# Patient Record
Sex: Male | Born: 1990 | Race: White | Hispanic: No | Marital: Married | Smoking: Never smoker
Health system: Southern US, Community
[De-identification: ages and names within clinical notes are randomized; demographics above are authoritative.]

## PROBLEM LIST (undated history)

## (undated) ENCOUNTER — Ambulatory Visit
Payer: PRIVATE HEALTH INSURANCE | Attending: Student in an Organized Health Care Education/Training Program | Primary: Student in an Organized Health Care Education/Training Program

## (undated) ENCOUNTER — Ambulatory Visit

## (undated) ENCOUNTER — Encounter

## (undated) ENCOUNTER — Encounter: Attending: Infectious Disease | Primary: Infectious Disease

## (undated) ENCOUNTER — Telehealth

## (undated) ENCOUNTER — Telehealth
Attending: Student in an Organized Health Care Education/Training Program | Primary: Student in an Organized Health Care Education/Training Program

## (undated) ENCOUNTER — Telehealth: Attending: Family | Primary: Family

## (undated) ENCOUNTER — Encounter: Attending: Family | Primary: Family

## (undated) ENCOUNTER — Encounter
Attending: Student in an Organized Health Care Education/Training Program | Primary: Student in an Organized Health Care Education/Training Program

## (undated) ENCOUNTER — Ambulatory Visit: Attending: Family | Primary: Family

## (undated) ENCOUNTER — Ambulatory Visit
Attending: Student in an Organized Health Care Education/Training Program | Primary: Student in an Organized Health Care Education/Training Program

## (undated) ENCOUNTER — Ambulatory Visit: Payer: PRIVATE HEALTH INSURANCE | Attending: Infectious Disease | Primary: Infectious Disease

## (undated) DIAGNOSIS — F32A Depression, unspecified: Secondary | ICD-10-CM

## (undated) DIAGNOSIS — Z21 Asymptomatic human immunodeficiency virus [HIV] infection status: Secondary | ICD-10-CM

## (undated) DIAGNOSIS — K219 Gastro-esophageal reflux disease without esophagitis: Secondary | ICD-10-CM

## (undated) DIAGNOSIS — F419 Anxiety disorder, unspecified: Secondary | ICD-10-CM

## (undated) DIAGNOSIS — F329 Major depressive disorder, single episode, unspecified: Secondary | ICD-10-CM

## (undated) DIAGNOSIS — B20 Human immunodeficiency virus [HIV] disease: Secondary | ICD-10-CM

## (undated) HISTORY — DX: Human immunodeficiency virus (HIV) disease: B20

## (undated) HISTORY — DX: Anxiety disorder, unspecified: F41.9

## (undated) HISTORY — DX: Gastro-esophageal reflux disease without esophagitis: K21.9

## (undated) HISTORY — PX: VASECTOMY: SHX75

## (undated) HISTORY — DX: Asymptomatic human immunodeficiency virus (hiv) infection status: Z21

## (undated) HISTORY — PX: EXTERNAL EAR SURGERY: SHX627

## (undated) MED ORDER — VORTIOXETINE 20 MG TABLET: ORAL | 0 days

---

## 2017-08-19 ENCOUNTER — Encounter: Payer: Self-pay | Admitting: Emergency Medicine

## 2017-08-19 ENCOUNTER — Emergency Department
Admission: EM | Admit: 2017-08-19 | Discharge: 2017-08-19 | Disposition: A | Discharge status: Home / Self Care | Payer: BLUE CROSS/BLUE SHIELD | Attending: Emergency Medicine | Admitting: Emergency Medicine

## 2017-08-19 DIAGNOSIS — S6981XA Other specified injuries of right wrist, hand and finger(s), initial encounter: Secondary | ICD-10-CM | POA: Diagnosis present

## 2017-08-19 DIAGNOSIS — Y929 Unspecified place or not applicable: Secondary | ICD-10-CM | POA: Diagnosis not present

## 2017-08-19 DIAGNOSIS — Y999 Unspecified external cause status: Secondary | ICD-10-CM | POA: Insufficient documentation

## 2017-08-19 DIAGNOSIS — W5321XA Bitten by squirrel, initial encounter: Secondary | ICD-10-CM | POA: Insufficient documentation

## 2017-08-19 DIAGNOSIS — S61230A Puncture wound without foreign body of right index finger without damage to nail, initial encounter: Secondary | ICD-10-CM | POA: Insufficient documentation

## 2017-08-19 DIAGNOSIS — Y9301 Activity, walking, marching and hiking: Secondary | ICD-10-CM | POA: Insufficient documentation

## 2017-08-19 HISTORY — DX: Depression, unspecified: F32.A

## 2017-08-19 HISTORY — DX: Major depressive disorder, single episode, unspecified: F32.9

## 2017-08-19 MED ORDER — AMOXICILLIN-POT CLAVULANATE 875-125 MG PO TABS: 1.0000 | ORAL_TABLET | Freq: Two times a day (BID) | ORAL | 0 refills | Status: DC

## 2017-08-19 NOTE — ED Provider Notes (Signed)
[facility]-EMERGENCY DEPARTMENT Provider Note   CSN: [id number] Arrival date & time: 08/19/17  1502     History   Chief Complaint Chief Complaint  Patient presents with  . Animal Bite    HPI Craig Hicks is a 26 y.o. male who presents to the emergency department for evaluation of scrotal bite to the right index finger, dorsal aspect of the PIP joint yesterday. Patient states he was taking his dog for a wall. Captured a small squirrel, the patient went to remove squirrel from his dog mouth squirrel bit him on the dorsal aspect of the right index finger just proximal to the PIP joint dorsally. Patient states he washed the wound. Patient has not had any pain swelling or redness through the index finger. His tetanus is up-to-date. He is concerned about possible need for rabies prophylaxis. Patient states the squirrel has been sent to [facility] for testing.  HPI  Past Medical History:  Diagnosis Date  . Depression     There are no active problems to display for this patient.   History reviewed. No pertinent surgical history.     Home Medications    Prior to Admission medications   Medication Sig Start Date End Date Taking? Authorizing Provider  amoxicillin-clavulanate (AUGMENTIN) 875-125 MG tablet Take 1 tablet by mouth every 12 (twelve) hours. 7 days 08/19/17   Evon Slack, PA-C    Family History No family history on file.  Social History Social History  Substance Use Topics  . Smoking status: Never Smoker  . Smokeless tobacco: Never Used  . Alcohol use Yes     Comment: occ     Allergies   Patient has no known allergies.   Review of Systems Review of Systems  Constitutional: Negative for chills and fever.  Musculoskeletal: Negative for arthralgias.  Skin: Positive for wound. Negative for color change, pallor and rash.  Neurological: Negative for tremors and headaches.     Physical Exam Updated Vital Signs BP 114/61 (BP Location: Left Arm)   Pulse 61    Temp 99.3 F (37.4 C) (Oral)   Resp 16   Ht 6\' 2"  (1.88 m)   Wt 77.1 kg (170 lb)   SpO2 98%   BMI 21.83 kg/m   Physical Exam  Constitutional: He appears well-developed and well-nourished.  HENT:  Head: Normocephalic and atraumatic.  Eyes: Conjunctivae are normal.  Cardiovascular: Normal rate.   Pulmonary/Chest: No respiratory distress.  Musculoskeletal:  Examination of the right hand shows patient is a full composite fist. There is a small abrasion with possible small superficial puncture wound to the dorsal aspect of the right index finger just proximal to the PIP joint.He has full range of motion of the P appearing DIP joint of the right index finger with no pain. There is no swelling, redness warmth or drainage.     ED Treatments / Results  Labs (all labs ordered are listed, but only abnormal results are displayed) Labs Reviewed - No data to display  EKG  EKG Interpretation None       Radiology No results found.  Procedures Procedures (including critical care time)  Medications Ordered in ED Medications - No data to display   Initial Impression / Assessment and Plan / ED Course  I have reviewed the triage vital signs and the nursing notes.  Pertinent labs & imaging results that were available during my care of the patient were reviewed by me and considered in my medical decision making (see chart for details).  26 year old male with squirrel bite to the dorsal aspect of the right index finger yesterday. Squirrel was sent to [facility] to be tested for rabies. Patient's tetanus is up-to-date. He is placed on oral antibiotic for infection prophylaxis due to puncture wound being close to the joint. We discussed the need for rabies prophylaxis and based on up-to-date guidelines/algorithm. Postexposure prophylaxis for squirrel bite to the distal extremity while the squirrel is undergoing testing for rabies is not indicated.  Final Clinical Impressions(s) / ED  Diagnoses   Final diagnoses:  Wound due to squirrel bite    New Prescriptions New Prescriptions   AMOXICILLIN-CLAVULANATE (AUGMENTIN) 875-125 MG TABLET    Take 1 tablet by mouth every 12 (twelve) hours. 7 days     Craig Hicks 08/19/17 1608    Craig Semen, MD 08/19/17 [phone]

## 2017-08-19 NOTE — ED Triage Notes (Signed)
Pt states that his dog was pulling a squirrel out of a bush yesterday and he tried getting the squirrel out of the dogs mouth and the squirrel bit him on the right index finger. Would like to get rabies vaccine.

## 2017-08-19 NOTE — ED Notes (Signed)

## 2018-01-25 ENCOUNTER — Ambulatory Visit: Payer: Self-pay

## 2018-03-22 ENCOUNTER — Emergency Department
Admission: EM | Admit: 2018-03-22 | Discharge: 2018-03-22 | Disposition: A | Discharge status: Home / Self Care | Payer: Worker's Compensation | Attending: Emergency Medicine | Admitting: Emergency Medicine

## 2018-03-22 ENCOUNTER — Other Ambulatory Visit: Payer: Self-pay

## 2018-03-22 ENCOUNTER — Encounter: Payer: Self-pay | Admitting: Emergency Medicine

## 2018-03-22 DIAGNOSIS — Z203 Contact with and (suspected) exposure to rabies: Secondary | ICD-10-CM | POA: Diagnosis not present

## 2018-03-22 NOTE — ED Provider Notes (Signed)
[facility] Emergency Department Provider Note   ____________________________________________   First MD Initiated Contact with Patient 03/22/18 1359     (approximate)  I have reviewed the triage vital signs and the nursing notes.   HISTORY  Chief Complaint Rabies Injection    HPI Craig Hicks is a 27 y.o. male patient exposed to a bat while at work.  Patient the bed fell on his neck.  Patient denied bite or scratch from the encounter.  Patient was able to capture the [facility] [employer] [facility] and turn over to animal control.  Patient states tetanus shot is less than 10 years.  Past Medical History:  Diagnosis Date  . Depression     There are no active problems to display for this patient.   History reviewed. No pertinent surgical history.  Prior to Admission medications   Medication Sig Start Date End Date Taking? Authorizing Provider  amoxicillin-clavulanate (AUGMENTIN) 875-125 MG tablet Take 1 tablet by mouth every 12 (twelve) hours. 7 days 08/19/17   Evon SlackGaines, Thomas C, PA-C    Allergies Patient has no known allergies.  No family history on file.  Social History Social History   Tobacco Use  . Smoking status: Never Smoker  . Smokeless tobacco: Never Used  Substance Use Topics  . Alcohol use: Yes    Comment: occ  . Drug use: No    Review of Systems Constitutional: No fever/chills Eyes: No visual changes. ENT: No sore throat. Cardiovascular: Denies chest pain. Respiratory: Denies shortness of breath. Gastrointestinal: No abdominal pain.  No nausea, no vomiting.  No diarrhea.  No constipation. Genitourinary: Negative for dysuria. Musculoskeletal: Negative for back pain. Skin: Negative for rash. Neurological: Negative for headaches, focal weakness or numbness.   ____________________________________________   PHYSICAL EXAM:  VITAL SIGNS: ED Triage Vitals  Enc Vitals Group     BP 03/22/18 1217 120/62     Pulse Rate 03/22/18 1217 77     Resp  03/22/18 1217 18     Temp 03/22/18 1217 98.8 F (37.1 C)     Temp Source 03/22/18 1217 Oral     SpO2 03/22/18 1217 99 %     Weight 03/22/18 1208 180 lb (81.6 kg)     Height 03/22/18 1208 6\' 2"  (1.88 m)     Head Circumference --      Peak Flow --      Pain Score 03/22/18 1208 0     Pain Loc --      Pain Edu? --      Excl. in GC? --    Constitutional: Alert and oriented. Well appearing and in no acute distress. Cardiovascular: Normal rate, regular rhythm. Grossly normal heart sounds.  Good peripheral circulation. Respiratory: Normal respiratory effort.  No retractions. Lungs CTAB. Musculoskeletal: No lower extremity tenderness nor edema.  No joint effusions. Neurologic:  Normal speech and language. No gross focal neurologic deficits are appreciated. No gait instability. Skin:  Skin is warm, dry and intact. No rash noted.  Patient posterior neck reveals no scratches, punctures, or bite marks. Psychiatric: Mood and affect are normal. Speech and behavior are normal.  ____________________________________________   LABS (all labs ordered are listed, but only abnormal results are displayed)  Labs Reviewed - No data to display ____________________________________________  EKG   ____________________________________________  RADIOLOGY  ED MD interpretation:    Official radiology report(s): No results found.  ____________________________________________   PROCEDURES  Procedure(s) performed:   Procedures  Critical Care performed: No  ____________________________________________  INITIAL IMPRESSION / ASSESSMENT AND PLAN / ED COURSE  As part of my medical decision making, I reviewed the following data within the electronic MEDICAL RECORD NUMBER   Patient presents with possible exposure to rabies from flying bat exposure.  The encounter produced no bite marks, scratches, or puncture wounds.  Animal control has a contained and confined to bed for observation for the next 10  days.  Patient given discharge care instruction advised to return back if the bed test positive for rabies.      ____________________________________________   FINAL CLINICAL IMPRESSION(S) / ED DIAGNOSES  Final diagnoses:  Contact with and (suspected) exposure to rabies     ED Discharge Orders    None       Note:  This document was prepared using Dragon voice recognition software and may include unintentional dictation errors.    Joni Reining, PA-C 03/22/18 1420    Sharyn Creamer, MD 03/23/18 [phone]

## 2018-03-22 NOTE — ED Notes (Signed)
Sent in by employer  States he had a bat fall from tree and landed on neck  No bite

## 2018-03-22 NOTE — ED Triage Notes (Signed)
Exposed to a bat while at work.  States bat fell onto back of neck.    Employed at [employer].

## 2019-12-31 DIAGNOSIS — B20 Human immunodeficiency virus [HIV] disease: Secondary | ICD-10-CM | POA: Insufficient documentation

## 2020-06-15 DIAGNOSIS — B20 Human immunodeficiency virus [HIV] disease: Principal | ICD-10-CM

## 2020-06-22 ENCOUNTER — Encounter
Admit: 2020-06-22 | Discharge: 2020-06-22 | Payer: PRIVATE HEALTH INSURANCE | Attending: Infectious Disease | Primary: Infectious Disease

## 2020-06-22 ENCOUNTER — Ambulatory Visit
Admit: 2020-06-22 | Discharge: 2020-06-23 | Payer: PRIVATE HEALTH INSURANCE | Attending: Addiction (Substance Use Disorder) | Primary: Addiction (Substance Use Disorder)

## 2020-06-22 DIAGNOSIS — B20 Human immunodeficiency virus [HIV] disease: Principal | ICD-10-CM

## 2020-06-22 LAB — HM HEPATITIS C SCREENING LAB: HM Hepatitis Screen: NEGATIVE

## 2020-06-24 DIAGNOSIS — B20 Human immunodeficiency virus [HIV] disease: Principal | ICD-10-CM

## 2020-06-24 MED ORDER — BICTEGRAVIR 50 MG-EMTRICITABINE 200 MG-TENOFOVIR ALAFENAM 25 MG TABLET
ORAL_TABLET | Freq: Every day | ORAL | 11 refills | 30.00000 days | Status: CP
Start: 2020-06-24 — End: ?
  Filled 2020-07-01: qty 30, 30d supply, fill #0

## 2020-06-25 DIAGNOSIS — B20 Human immunodeficiency virus [HIV] disease: Principal | ICD-10-CM

## 2020-06-30 NOTE — Unmapped (Signed)
[facility] Specialty Medication Onboarding    Specialty Medication: Biktarvy tablets  Prior Authorization: Not Required   Financial Assistance: Yes - copay card approved as secondary   Final Copay/Day Supply: $0 / 30 days    Insurance Restrictions: Yes - max 1 month supply     Notes to Pharmacist:     The triage team has completed the benefits investigation and has determined that the patient is able to fill this medication at [facility]. Please contact the patient to complete the onboarding or follow up with the prescribing physician as needed.

## 2020-06-30 NOTE — Unmapped (Signed)
Patient complete RW application. Patient is eligible for RW B&C grant services and Caps on Charges. Eligibility expires 03/24/21.       RW Eligibility Form informing patient about RW services and Caps on charges was sent to patient.    If HMAP app: N/A    Larey Dresser, [place] [facility] Benefits Admin  Duration of intervention: 10 minutes

## 2020-06-30 NOTE — Unmapped (Signed)
[facility]   Patient Onboarding/Medication Counseling    Mr.Austin Thompson is a 29 y.o. male with HIV who I am counseling today on initiation of therapy.  I am speaking to the patient.    Was a Nurse, learning disability used for this call? No    Verified patient's date of birth / HIPAA.    Specialty medication(s) to be sent: Infectious Disease: Biktarvy      Non-specialty medications/supplies to be sent: n/a      Medications not needed at this time: n/a         Biktarvy (bictegravir, emtricitabine, and tenofovir alafenamide)    Medication & Administration     Dosage: Take 1 tablet by mouth daily    Administration: Take without regard to food    Adherence/Missed dose instructions: take missed dose as soon as you remember. If it is close to the time of your next dose, skip the dose and resume with your next scheduled dose.    Goals of Therapy     To suppress viral replication and keep patient's HIV undetectable by lab tests    Side Effects & Monitoring Parameters     Common Side Effects:  ??? Diarrhea  ??? Upset stomach  ??? Headache  ??? Changes in Weight  ??? Changes in mood    The following side effects should be reported to the provider:     ?? If patient experiences: signs of an allergic reaction (rash; hives; itching; red, swollen, blistered, or peeling skin with or without fever; wheezing; tightness in the chest or throat; trouble breathing, swallowing, or talking; unusual hoarseness; or swelling of the mouth, face, lips, tongue, or throat)  ?? signs of kidney problems (unable to pass urine, change in how much urine is passed, blood in the urine, or a big weight gain)  ?? signs of liver problems (dark urine, feeling tired, not hungry, upset stomach or stomach pain, light-colored stools, throwing up, or yellow skin or eyes)  ?? signs of lactic acidosis (fast breathing, fast heartbeat, a heartbeat that does not feel normal, very bad upset stomach or throwing up, feeling very sleepy, shortness of breath, feeling very tired or weak, very bad dizziness, feeling cold, or muscle pain or cramps)  Weight gain: some patients have reported weight gain after starting this medication. The amount of weight can vary.    Monitoring Parameters:  ?? CD4  Count  ?? HIV RNA plasma levels,  ?? Liver function  ?? Total bilirubin  ?? serum creatinine  ?? urine glucose  ?? urine protein (prior to or when initiating therapy and as clinically indicated during therapy);       Drug/Food Interactions     ??? Medication list reviewed in Epic. The patient was instructed to inform the care team before taking any new medications or supplements. No drug interactions identified.   ??? Calcium Salts: May decrease the serum concentration of Biktarvy. If taken with food, Biktarvy can be administered with calcium salts.   ??? Iron Preparations: May decrease the serum concentration of Biktarvy. If taken with food, Biktarvy can be administered with Ferrous sulfate. If taken on an empty stomach, Biktarvy must be taken 2 hours before ferrous sulfate. Avoid other iron salts.    Contraindications, Warnings, & Precautions     ??? Black Box Warning: Severe acute exacertbations of HBV have been reported in patients coinfected with HIV-1 and HBV fllowing discontinuation of therapy  ??? Coadministration with dofetilide, rifampin is contraindicated  ??? Immune reconstitution  syndrome: Patients may develop immune reconstitution syndrome, resulting in the occurrence of an inflammatory response to an indolent or residual opportunistic infection or activation of autoimmune disorders (eg, Graves disease, polymyositis, Guillain-Barr?? syndrome, autoimmune hepatitis)   ??? Lactic acidosis/hepatomegaly  ??? Renal toxicity: patients with preexisting renal impairment and those taking nephrotoxic agents (including NSAIDs) are at increased risk.     Storage, Handling Precautions, & Disposal     ??? Store in the original container at room temperature.   ??? Keep lid tightly closed.   ??? Store in a dry place. Do not store in a bathroom.   ??? Keep all drugs in a safe place. Keep all drugs out of the reach of children and pets.   ??? Throw away unused or expired drugs. Do not flush down a toilet or pour down a drain unless you are told to do so. Check with your pharmacist if you have questions about the best way to throw out drugs. There may be drug take-back programs in your area.        Current Medications (including OTC/herbals), Comorbidities and Allergies     Current Outpatient Medications   Medication Sig Dispense Refill   ??? amoxicillin (MOXATAG) 775 mg 24 hr tablet Take 775 mg by mouth daily. (Patient not taking: Reported on 06/30/2020)     ??? bictegrav-emtricit-tenofov ala (BIKTARVY) 50-200-25 mg tablet Take 1 tablet by mouth daily. 30 tablet 11   ??? fexofenadine (ALLEGRA) 180 MG tablet Take 180 mg by mouth daily.       No current facility-administered medications for this visit.       No Known Allergies    Patient Active Problem List   Diagnosis   ??? HIV disease (CMS-HCC)   ??? LAD (lymphadenopathy), cervical   ??? Depression       Reviewed and up to date in Epic.    Appropriateness of Therapy     Is medication and dose appropriate based on diagnosis? Yes    Prescription has been clinically reviewed: Yes    Baseline Quality of Life Assessment      How many days over the past month did your HIV  keep you from your normal activities? For example, brushing your teeth or getting up in the morning. 0    Financial Information     Medication Assistance provided: Copay Assistance    Anticipated copay of $0.00 reviewed with patient. Verified delivery address.    Delivery Information     Scheduled delivery date: 07/01/20    Expected start date: 07/01/20    Medication will be delivered via Same Day Courier to the prescription address in [facility].  This shipment will not require a signature.      Explained the services we provide at [facility] and that each month we would call to set up refills.  Stressed importance of returning phone calls so that we could ensure they receive their medications in time each month.  Informed patient that we should be setting up refills 7-10 days prior to when they will run out of medication.  A pharmacist will reach out to perform a clinical assessment periodically.  Informed patient that a welcome packet and a drug information handout will be sent.      Patient verbalized understanding of the above information as well as how to contact the pharmacy at [phone] option 4 with any questions/concerns.  The pharmacy is open Monday through Friday 8:30am-4:30pm.  A pharmacist is available 24/7  via pager to answer any clinical questions they may have.    Patient Specific Needs     - Does the patient have any physical, cognitive, or cultural barriers? No    - Patient prefers to have medications discussed with  Patient     - Is the patient or caregiver able to read and understand education materials at a high school level or above? Yes    - Patient's primary language is  English     - Is the patient high risk? No     - Does the patient require a Care Management Plan? No     - Does the patient require physician intervention or other additional services (i.e. nutrition, smoking cessation, social work)? No      [name]  [facility] Specialty Pharmacist

## 2020-07-01 MED FILL — BIKTARVY 50 MG-200 MG-25 MG TABLET: 30 days supply | Qty: 30 | Fill #0 | Status: AC

## 2020-07-02 LAB — HLA-B*5701: Lab: NEGATIVE

## 2020-07-10 LAB — GNSURE INTEGRASE: Lab: 0

## 2020-07-10 LAB — HIV GENOTYPING: Lab: 0

## 2020-07-23 NOTE — Unmapped (Signed)
[facility] Clinical Assessment & Refill Coordination Note    Austin Thompson, DOB: [birth year]  Phone: [phone] (home)     All above HIPAA information was verified with patient.     Was a Nurse, learning disability used for this call? No    Specialty Medication(s):   Infectious Disease: Biktarvy     Current Outpatient Medications   Medication Sig Dispense Refill   ??? amoxicillin (MOXATAG) 775 mg 24 hr tablet Take 775 mg by mouth daily. (Patient not taking: Reported on 06/30/2020)     ??? bictegrav-emtricit-tenofov ala (BIKTARVY) 50-200-25 mg tablet Take 1 tablet by mouth daily. 30 tablet 11   ??? fexofenadine (ALLEGRA) 180 MG tablet Take 180 mg by mouth daily.       No current facility-administered medications for this visit.        Changes to medications: Boss reports no changes at this time.    No Known Allergies    Changes to allergies: No    SPECIALTY MEDICATION ADHERENCE     Biktarvy   : 12 days of medicine on hand. He did not start the medication on 07/08 as originally stated. He says he takes it religiously at the same time every day.    Medication Adherence    Patient reported X missed doses in the last month: 0  Specialty Medication: Biktarvy  Patient is on additional specialty medications: No  Demonstrates understanding of importance of adherence: yes  Informant: patient  Provider-estimated medication adherence level: good  Patient is at risk for Non-Adherence: No          Specialty medication(s) dose(s) confirmed: Regimen is correct and unchanged.     Are there any concerns with adherence? No    Adherence counseling provided? Not needed    CLINICAL MANAGEMENT AND INTERVENTION      Clinical Benefit Assessment:    Do you feel the medicine is effective or helping your condition? Yes, he feels much better.    Clinical Benefit counseling provided? Not needed    Adverse Effects Assessment:    Are you experiencing any side effects? No    Are you experiencing difficulty administering your medicine? No Quality of Life Assessment:    How many days over the past month did your HIV  keep you from your normal activities? For example, brushing your teeth or getting up in the morning. 0    Have you discussed this with your provider? Not needed    Therapy Appropriateness:    Is therapy appropriate? Yes, therapy is appropriate and should be continued    DISEASE/MEDICATION-SPECIFIC INFORMATION      N/A    PATIENT SPECIFIC NEEDS     - Does the patient have any physical, cognitive, or cultural barriers? No    - Is the patient high risk? No    - Does the patient require a Care Management Plan? No     - Does the patient require physician intervention or other additional services (i.e. nutrition, smoking cessation, social work)? No      SHIPPING     Specialty Medication(s) to be Shipped:   Infectious Disease: Biktarvy    Other medication(s) to be shipped: No additional medications requested for fill at this time     Changes to insurance: No    Delivery Scheduled: Yes, Expected medication delivery date: 07/27/20.     Medication will be delivered via Next Day Courier to the confirmed prescription address in [place].    The patient  will receive a drug information handout for each medication shipped and additional FDA Medication Guides as required.  Verified that patient has previously received a Conservation officer, historic buildings.    All of the patient's questions and concerns have been addressed.    [name]   [facility] Specialty Pharmacist

## 2020-07-26 MED FILL — BIKTARVY 50 MG-200 MG-25 MG TABLET: 30 days supply | Qty: 30 | Fill #1 | Status: AC

## 2020-07-26 MED FILL — BIKTARVY 50 MG-200 MG-25 MG TABLET: ORAL | 30 days supply | Qty: 30 | Fill #1

## 2020-08-09 NOTE — Unmapped (Signed)
Duration of Intervention: 5 min     SW texted patient to remind of ID clinic appointment tomorrow at 8:30am.  Patient did not answer back.     Lamone Ferrelli LCSW, LCAS  [facility] Social Work   Direct: [phone]

## 2020-08-10 ENCOUNTER — Encounter
Admit: 2020-08-10 | Discharge: 2020-08-10 | Payer: PRIVATE HEALTH INSURANCE | Attending: Infectious Disease | Primary: Infectious Disease

## 2020-08-10 DIAGNOSIS — B20 Human immunodeficiency virus [HIV] disease: Secondary | ICD-10-CM | POA: Diagnosis not present

## 2020-08-10 DIAGNOSIS — Z6825 Body mass index (BMI) 25.0-25.9, adult: Secondary | ICD-10-CM | POA: Diagnosis not present

## 2020-08-10 DIAGNOSIS — R590 Localized enlarged lymph nodes: Secondary | ICD-10-CM | POA: Diagnosis not present

## 2020-08-10 DIAGNOSIS — G629 Polyneuropathy, unspecified: Secondary | ICD-10-CM | POA: Diagnosis not present

## 2020-08-10 LAB — LYMPH MARKER LIMITED,FLOW
ABSOLUTE CD4 CNT: 528 {cells}/uL (ref 510–2320)
ABSOLUTE CD8 CNT: 640 {cells}/uL (ref 180–1520)
CD3% (T CELLS)": 76 % (ref 61–86)
CD4% (T HELPER)": 33 % — ABNORMAL LOW (ref 34–58)
CD4:CD8 RATIO: 0.8 — ABNORMAL LOW (ref 0.9–4.8)

## 2020-08-10 LAB — FOLATE: Folate:MCnc:Pt:Ser/Plas:Qn:: 15.6

## 2020-08-10 LAB — CBC W/ AUTO DIFF
BASOPHILS ABSOLUTE COUNT: 0.1 10*9/L (ref 0.0–0.1)
BASOPHILS RELATIVE PERCENT: 1.2 %
EOSINOPHILS ABSOLUTE COUNT: 0.2 10*9/L (ref 0.0–0.7)
EOSINOPHILS RELATIVE PERCENT: 3.7 %
HEMATOCRIT: 46.3 % (ref 38.0–50.0)
HEMOGLOBIN: 15.8 g/dL (ref 13.5–17.5)
LYMPHOCYTES ABSOLUTE COUNT: 1.6 10*9/L (ref 0.7–4.0)
LYMPHOCYTES RELATIVE PERCENT: 37.7 %
MEAN CORPUSCULAR HEMOGLOBIN CONC: 34.2 g/dL (ref 30.0–36.0)
MEAN CORPUSCULAR HEMOGLOBIN: 30 pg (ref 26.0–34.0)
MEAN CORPUSCULAR VOLUME: 87.8 fL (ref 81.0–95.0)
MEAN PLATELET VOLUME: 7.6 fL (ref 7.0–10.0)
MONOCYTES ABSOLUTE COUNT: 0.4 10*9/L (ref 0.1–1.0)
MONOCYTES RELATIVE PERCENT: 10.2 %
NEUTROPHILS ABSOLUTE COUNT: 2 10*9/L (ref 1.7–7.7)
NEUTROPHILS RELATIVE PERCENT: 47.2 %
PLATELET COUNT: 225 10*9/L (ref 150–450)
RED BLOOD CELL COUNT: 5.28 10*12/L (ref 4.32–5.72)
RED CELL DISTRIBUTION WIDTH: 14.5 % (ref 12.0–15.0)

## 2020-08-10 LAB — BASIC METABOLIC PANEL
BLOOD UREA NITROGEN: 11 mg/dL (ref 9–23)
BUN / CREAT RATIO: 12
CALCIUM: 10.4 mg/dL (ref 8.7–10.4)
CHLORIDE: 106 mmol/L (ref 98–107)
CO2: 30.5 mmol/L (ref 20.0–31.0)
CREATININE: 0.9 mg/dL
EGFR CKD-EPI AA MALE: >90 mL/min/{1.73_m2} (ref >=60)
EGFR CKD-EPI NON-AA MALE: >90 mL/min/{1.73_m2} (ref >=60)
GLUCOSE RANDOM: 92 mg/dL (ref 70–179)
POTASSIUM: 3.8 mmol/L (ref 3.4–4.5)
SODIUM: 141 mmol/L (ref 135–145)

## 2020-08-10 LAB — WBC ADJUSTED: Leukocytes:NCnc:Pt:Bld:Qn:: 4.3

## 2020-08-10 LAB — ABSOLUTE CD3 CNT: Lab: 1216

## 2020-08-10 LAB — BILIRUBIN, TOTAL: BILIRUBIN TOTAL: 0.6 mg/dL (ref 0.3–1.2)

## 2020-08-10 LAB — VITAMIN B-12: Cobalamins:MCnc:Pt:Ser/Plas:Qn:: 598

## 2020-08-10 LAB — BILIRUBIN TOTAL: Bilirubin:MCnc:Pt:Ser/Plas:Qn:: 0.6

## 2020-08-10 LAB — GLUCOSE RANDOM: Glucose:MCnc:Pt:Ser/Plas:Qn:: 92

## 2020-08-10 LAB — ALT (SGPT): Alanine aminotransferase:CCnc:Pt:Ser/Plas:Qn:: 14

## 2020-08-10 LAB — AST (SGOT): Aspartate aminotransferase:CCnc:Pt:Ser/Plas:Qn:: 19

## 2020-08-10 MED ORDER — BICTEGRAVIR 50 MG-EMTRICITABINE 200 MG-TENOFOVIR ALAFENAM 25 MG TABLET
ORAL_TABLET | Freq: Every day | ORAL | 11 refills | 30.00000 days | Status: CP
Start: 2020-08-10 — End: ?
  Filled 2020-08-23: qty 30, 30d supply, fill #0

## 2020-08-10 NOTE — Unmapped (Signed)
Return Patient Note    Austin Thompson is a 29 y.o. male (DOB: Jul 28, 1991) who is seen in follow-up     Assessment/Plan:     HIV disease (CMS-HCC)  Doing well with complaints as above  No missed doses or medication related side effects  Continue current regimen of: BIKTARVY  Check viral load, CD4 and other safety labs today to assess initial response to treatment  Follow up as directed    LAD (lymphadenopathy), cervical  resolved    Neuropathy  Unilateral (just on left)  Unlikely related to HIV - suggest follow up with primary MD to see if recommends MRI to look for nerve compression  Will check B12, folate  Discussed avoiding energy drinks with niacin  Does not want to try neurontin at this time as not really bothersome symptom      COVID Education:  - Discussed the current COVID pandemic and strategies to avoid infection and what to do if the patient becomes symptomatic.  - Discussed use of masks, vaccines, social distancing, symptoms, etc.      Prevention Education     -Discussed U=U and Stressed importance of adherence and viral suppression       -Discussed disclosure of HIV status with sex partners, PrEP, condom use and STIs      Sexual health  See bottom of note for most recent test dates and results.  ?? GC/CT NAATs -- not needed to be checked today  ?? RPR -- not needed to be checked today  ?? Anal pap -- deferred to age 83      Educational and counseling services took 20 minutes of today's visit time.    Total visit lasted 30 minutes     Follow Up:  Return to clinic in 4 months or sooner if needed.     Return in about 4 months (around 12/10/2020).  Future Appointments   Date Time Provider Department Center   12/14/2020  8:00 AM Barkley Bruns, MD [facility]           Subjective:      Austin Thompson is a 29 y.o. male (DOB: [birth year])  who is seen in follow-up regarding Follow-up  .     HPI:     Patient is doing well and here for routine HIV follow-up appointment.  Started meds when diagnosed with HIV at last appt. Started Biktarvy around 07/01/20  Brings in list of questions to discuss today  Tolerating Biktarvy well  Still has some tingling sensations in left foot, also in left hand (nothing on right). Feels like cervical LN much improved since starting meds  Noticing some deposits under skin on left elbow (similar to ones he had on right elbow in past that were removed by dermatologist). At time was told then were WBC clumps but not infections. Does have appt with dermatologist next month      See above for full review by problem    HIV   no missed doses - started treatment around mid July 2021 with Biktarvy   no medication side effects (maybe some more vivid dreams)n  No intercurrent illnesses or hospitalizations.   Has not seen any other medical providers.       Review of Systems:     Pertinent items are noted in HPI.      Current Meds:  Current Outpatient Medications   Medication Sig Dispense Refill   ??? bictegrav-emtricit-tenofov ala (BIKTARVY) 50-200-25 mg tablet Take 1 tablet by mouth daily.  30 tablet 11   ??? fexofenadine (ALLEGRA) 180 MG tablet Take 180 mg by mouth daily.       No current facility-administered medications for this visit.       Allergies: Patient has no known allergies.    History: I reviewed Agron's medical and surgical history and updated as appropriate.    No past medical history on file.    No past surgical history on file.    Family History:  I have reviewed past family history with no new findings since the last clinic appointment dated    No family history on file.    Social History  General     Living situation  Living with partner   In school      Employment   (employed at same job)        Sexual  Number of Male Sexual Partners in Past 60 Days:  (he and wife have not had sex since diagnosis but we discusse)   Relationship      Number partners      Oral sex   .     Anal sex   .     Vaginal sex   .          Substance Use     Alcohol      Tobacco      Marijuana      Illicits .     IDU            Objective:      Physical Exam:    BP 126/71 (BP Site: L Arm, BP Position: Sitting)  - Pulse 75  - Temp 36.1 ??C (96.9 ??F) (Temporal)  - Ht 188 cm (6' 2)  - Wt 89.4 kg (197 lb 3.2 oz)  - BMI 25.32 kg/m??     GEN:  looks well, no apparent distress  HEENT: PERRL, EOMI, no thrush, lesions or exudate no cervical LAD appreciated, tongue with some scalloping on edges, no redness   CV:RRR, no m/r/g, S1/S2  PULM:CTAB ant/post, normal work of breathing  WJX:BJYN, NTND, no masses  WG:NFAOZHYQ  RECTAL:deferred  SKIN:no petechiae, ecchymoses or obvious rashes on clothed exam  NEURO:grossly nonfocal  PSYCH:attentive, appropriate affect, good eye contact, fluent speech  Ext: ? Tophi right elbow    Laboratory:    I have reviewed all recent lab results including:    Results for orders placed or performed in visit on 08/10/20   Metabolic Panel, Basic   Result Value Ref Range    Sodium 141 135 - 145 mmol/L    Potassium 3.8 3.4 - 4.5 mmol/L    Chloride 106 98 - 107 mmol/L    CO2 30.5 20.0 - 31.0 mmol/L    Anion Gap 5 5 - 14 mmol/L    BUN 11 9 - 23 mg/dL    Creatinine 6.57 8.46 - 1.10 mg/dL    BUN/Creatinine Ratio 12     EGFR CKD-EPI Non-African American, Male >90 >=60 mL/min/1.43m2    EGFR CKD-EPI African American, Male >90 >=60 mL/min/1.24m2    Glucose 92 70 - 179 mg/dL    Calcium 96.2 8.7 - 95.2 mg/dL   Bilirubin, total   Result Value Ref Range    Total Bilirubin 0.6 0.3 - 1.2 mg/dL   AST   Result Value Ref Range    AST 19 <=34 U/L   ALT   Result Value Ref Range    ALT 14 10 - 49 U/L   CBC  w/ Differential   Result Value Ref Range    WBC 4.3 3.5 - 10.5 10*9/L    RBC 5.28 4.32 - 5.72 10*12/L    HGB 15.8 13.5 - 17.5 g/dL    HCT 16.1 09.6 - 04.5 %    MCV 87.8 81.0 - 95.0 fL    MCH 30.0 26.0 - 34.0 pg    MCHC 34.2 30.0 - 36.0 g/dL    RDW 40.9 81.1 - 91.4 %    MPV 7.6 7.0 - 10.0 fL    Platelet 225 150 - 450 10*9/L    Neutrophils % 47.2 %    Lymphocytes % 37.7 %    Monocytes % 10.2 %    Eosinophils % 3.7 %    Basophils % 1.2 %    Absolute Neutrophils 2.0 1.7 - 7.7 10*9/L    Absolute Lymphocytes 1.6 0.7 - 4.0 10*9/L    Absolute Monocytes 0.4 0.1 - 1.0 10*9/L    Absolute Eosinophils 0.2 0.0 - 0.7 10*9/L    Absolute Basophils 0.1 0.0 - 0.1 10*9/L             Sexual Health & Screening Data  No results found for: LABRPR, RPRTIT, GCNAA, GCSOR, CTNAA, CTSOR      There is no immunization history on file for this patient.

## 2020-08-10 NOTE — Unmapped (Signed)
Unilateral (just on left)  Unlikely related to HIV - suggest follow up with primary MD to see if recommends MRI to look for nerve compression  Will check B12, folate  Discussed avoiding energy drinks with niacin  Does not want to try neurontin at this time as not really bothersome symptom

## 2020-08-10 NOTE — Unmapped (Signed)
Doing well with complaints as above  No missed doses or medication related side effects  Continue current regimen of: BIKTARVY  Check viral load, CD4 and other safety labs today to assess initial response to treatment  Follow up as directed

## 2020-08-10 NOTE — Unmapped (Signed)
resolved 

## 2020-08-10 NOTE — Unmapped (Signed)
Your three most recent CD4 T-cell counts are below.   Here are a few things to keep in mind when looking at your numbers:   ??? For most people, we're checking CD4 counts once or twice a year.  ??? It's normal for your CD4 count to be different from visit to visit. Please let us know if you have any questions.      Lab Results   Component Value Date    Absolute CD4 Count 540 06/22/2020     Lab Results   Component Value Date    HIV RNA 35,300 (H) 06/22/2020        GENERAL INSTRUCTIONS   -- clinic phone number is [phone]  -- return to clinic in 3 months   -- if need sooner appointment, please contact clinic   -- if need urgent assistance, please call 911 or go to ED.

## 2020-08-11 LAB — HIV RNA QNT RSLT: HIV 1 RNA:PrThr:Pt:Ser/Plas:Ord:Probe.amp.tar: DETECTED — AB

## 2020-08-11 LAB — HIV RNA, QUANTITATIVE, PCR: HIV RNA QNT RSLT: DETECTED — AB

## 2020-08-16 DIAGNOSIS — F410 Panic disorder [episodic paroxysmal anxiety] without agoraphobia: Secondary | ICD-10-CM | POA: Diagnosis not present

## 2020-08-16 DIAGNOSIS — Z1159 Encounter for screening for other viral diseases: Secondary | ICD-10-CM | POA: Diagnosis not present

## 2020-08-16 DIAGNOSIS — R0789 Other chest pain: Secondary | ICD-10-CM | POA: Diagnosis not present

## 2020-08-16 DIAGNOSIS — R42 Dizziness and giddiness: Secondary | ICD-10-CM | POA: Diagnosis not present

## 2020-08-16 DIAGNOSIS — Z1389 Encounter for screening for other disorder: Secondary | ICD-10-CM | POA: Diagnosis not present

## 2020-08-16 DIAGNOSIS — Z03818 Encounter for observation for suspected exposure to other biological agents ruled out: Secondary | ICD-10-CM | POA: Diagnosis not present

## 2020-08-16 DIAGNOSIS — R5383 Other fatigue: Secondary | ICD-10-CM | POA: Diagnosis not present

## 2020-08-16 DIAGNOSIS — Z112 Encounter for screening for other bacterial diseases: Secondary | ICD-10-CM | POA: Diagnosis not present

## 2020-08-16 DIAGNOSIS — Z1152 Encounter for screening for COVID-19: Secondary | ICD-10-CM | POA: Diagnosis not present

## 2020-08-16 DIAGNOSIS — Z118 Encounter for screening for other infectious and parasitic diseases: Secondary | ICD-10-CM | POA: Diagnosis not present

## 2020-08-19 DIAGNOSIS — F329 Major depressive disorder, single episode, unspecified: Secondary | ICD-10-CM | POA: Diagnosis not present

## 2020-08-19 DIAGNOSIS — B20 Human immunodeficiency virus [HIV] disease: Secondary | ICD-10-CM | POA: Diagnosis not present

## 2020-08-19 NOTE — Unmapped (Signed)
Received message from pharmacist [name] expressing concern about some symptoms patient is having and concerned about possible lactic acidosis vs panic attack.  Patient had reported calling EMS earlier in the day because he felt like maybe he was having a heart attack.  EMS did an EKG and told him they felt like he was having a panic attack and gave him hydroxyzine and he reports feeling some better.  He reports initially feeling light headed and dizzy and then having his vision go in and out. Then had some cramping in his left hand and worsening of tingling in his fingers which was previously present, along with feeling his heart race and hearing his hearbeat in his ears.      Of note he says that he works a very physical job outside and he sweats a lot.  He reports that he feels like he consumes enough water.  I asked him if he felt like his anxiety level had increased since his diagnosis and he did say that he does feel much more tuned in to his health and such.      I discussed with SW Melissa Hornbeck who agrees to call and f/u.  Did let patient know that if this happens again it would probably be good for him to be seen to make sure there is nothing medically going on.

## 2020-08-19 NOTE — Unmapped (Addendum)
[facility] Pharm[facility] Pharmacist Intervention    Type of intervention: Medication side effect    Medication: Biktarvy    Problem: Patient asked if Susanne Borders can cause anxiety or cardiovascular problems    Intervention: I told him that I can not find any links to anxiety.  I had him describe his symptoms.  He said he had a rapid heart beat, muscle cramps and pain, dizziness, lightheadedness and  feeling like he may pass out.  He denied any problems of feeling unusually cold, rapid/shallow breathing or being  unusually sleepy.  He had EMS come to his home and performed an EKG and they told him they think it was probably a panic attack.  Since a lot of the symptoms that he experienced are also symptoms of lactic acidosis, I consulted with Rayburn Ma, RN.   I transferred him to Agnes Lawrence so he can ask Birch some more questions.      Follow up needed: Yes.  I will call him next week to see how he is doing    Approximate time spent: 30 minutes    [facility]   [facility] Shared [facility] Specialty Pharmacist

## 2020-08-19 NOTE — Unmapped (Signed)
[facility] Special[facility] Pharmacy Refill Coordination Note    Specialty Medication(s) to be Shipped:   Infectious Disease: Biktarvy    Other medication(s) to be shipped: No additional medications requested for fill at this time     Gregroy Dombkowski, DOB: 03-27-1991  Phone: [phone] (home)       All above HIPAA information was verified with patient.     Was a Nurse, learning disability used for this call? No    Completed refill call assessment today to schedule patient's medication shipment from the [facility] [phone]).       Specialty medication(s) and dose(s) confirmed: Regimen is correct and unchanged.   Changes to medications: Bowie reports starting the following medications: hydroxyzine   Changes to insurance: No  Questions for the pharmacist: Yes: Patient wanted to find out if Biktarvy can cause  anxiety or cardiovascular problems.    Confirmed patient received Conservation officer, historic buildings with first shipment. The patient will receive a drug information handout for each medication shipped and additional FDA Medication Guides as required.       DISEASE/MEDICATION-SPECIFIC INFORMATION        N/A    SPECIALTY MEDICATION ADHERENCE     Medication Adherence    Patient reported X missed doses in the last month: 0  Specialty Medication: Biktarvy  Patient is on additional specialty medications: No  Demonstrates understanding of importance of adherence: yes  Informant: patient  Provider-estimated medication adherence level: good  Patient is at risk for Non-Adherence: No          Biktarvy   : 11 days of medicine on hand         SHIPPING     Shipping address confirmed in Epic.     Delivery Scheduled: Yes, Expected medication delivery date: 08/24/20.     Medication will be delivered via Next Day Courier to the prescription address in Epic WAM.    [name]   [facility] Shared [facility] Specialty Pharmacist

## 2020-08-20 DIAGNOSIS — A6920 Lyme disease, unspecified: Secondary | ICD-10-CM | POA: Diagnosis not present

## 2020-08-20 NOTE — Unmapped (Unsigned)
Medical Case Management Social Work Note     Duration of Intervention: < 5 min      REASON/TYPE OF CONTACT: Phone, SW reached out to patient to assess symptoms of possible panic attack.     ASSESSMENT: Patient did not answer.     INTERVENTION:  SW left a discreet message for a call back.     PLAN:  patient can follow up as needed.     Kafi Dotter LCSW, LCAS  [facility] Social Work   Direct: [phone]

## 2020-08-23 MED FILL — BIKTARVY 50 MG-200 MG-25 MG TABLET: 30 days supply | Qty: 30 | Fill #0 | Status: AC

## 2020-08-26 DIAGNOSIS — R519 Headache, unspecified: Secondary | ICD-10-CM | POA: Diagnosis not present

## 2020-08-26 DIAGNOSIS — M542 Cervicalgia: Secondary | ICD-10-CM | POA: Diagnosis not present

## 2020-08-26 DIAGNOSIS — R079 Chest pain, unspecified: Secondary | ICD-10-CM | POA: Diagnosis not present

## 2020-08-26 NOTE — Unmapped (Signed)
[facility] Shared [facility] Specialty Pharmacy Pharmacist Intervention    Type of intervention: Follow-up call regarding anxiety / cardiovascular symptoms he described last week    Medication: Biktarvy    Problem: Last week patient reported symptoms of what could be anxiety, cardiovascular problems, or lactic acidosis.    Intervention: I called to see how he was doing and he told me that it was determined that he had a pinched nerve in his neck leading to some of his symptoms that lead to increased anxiety.  He said he is receiving treatment for the pinched nerve and is feeling much better    Follow up needed: no    Approximate time spent: 5+ minutes    Navaeh Kehres [facility]   [facility] Shared [facility] Specialty Pharmacist

## 2020-08-27 ENCOUNTER — Ambulatory Visit: Admit: 2020-08-27 | Discharge: 2020-08-28 | Payer: PRIVATE HEALTH INSURANCE

## 2020-08-27 DIAGNOSIS — M25519 Pain in unspecified shoulder: Secondary | ICD-10-CM | POA: Diagnosis not present

## 2020-08-27 DIAGNOSIS — M542 Cervicalgia: Secondary | ICD-10-CM | POA: Diagnosis not present

## 2020-09-02 ENCOUNTER — Other Ambulatory Visit: Payer: Self-pay

## 2020-09-02 ENCOUNTER — Emergency Department
Admission: EM | Admit: 2020-09-02 | Discharge: 2020-09-02 | Disposition: A | Discharge status: Home / Self Care | Payer: BC Managed Care – PPO | Attending: Emergency Medicine | Admitting: Emergency Medicine

## 2020-09-02 ENCOUNTER — Emergency Department: Payer: BC Managed Care – PPO

## 2020-09-02 ENCOUNTER — Encounter: Payer: Self-pay | Admitting: Emergency Medicine

## 2020-09-02 DIAGNOSIS — R531 Weakness: Secondary | ICD-10-CM | POA: Insufficient documentation

## 2020-09-02 DIAGNOSIS — R55 Syncope and collapse: Secondary | ICD-10-CM | POA: Insufficient documentation

## 2020-09-02 DIAGNOSIS — R200 Anesthesia of skin: Secondary | ICD-10-CM

## 2020-09-02 DIAGNOSIS — M542 Cervicalgia: Secondary | ICD-10-CM | POA: Insufficient documentation

## 2020-09-02 DIAGNOSIS — R079 Chest pain, unspecified: Secondary | ICD-10-CM | POA: Diagnosis not present

## 2020-09-02 DIAGNOSIS — R519 Headache, unspecified: Secondary | ICD-10-CM | POA: Diagnosis not present

## 2020-09-02 DIAGNOSIS — M79603 Pain in arm, unspecified: Secondary | ICD-10-CM | POA: Diagnosis not present

## 2020-09-02 DIAGNOSIS — R202 Paresthesia of skin: Secondary | ICD-10-CM | POA: Diagnosis not present

## 2020-09-02 DIAGNOSIS — R52 Pain, unspecified: Secondary | ICD-10-CM | POA: Diagnosis not present

## 2020-09-02 LAB — BASIC METABOLIC PANEL
Anion gap: 9 (ref 5–15)
BUN: 12 mg/dL (ref 6–20)
CO2: 26 mmol/L (ref 22–32)
Calcium: 9.4 mg/dL (ref 8.9–10.3)
Chloride: 104 mmol/L (ref 98–111)
Creatinine, Ser: 0.98 mg/dL (ref 0.61–1.24)
GFR calc Af Amer: >60 mL/min (ref >60)
GFR calc non Af Amer: >60 mL/min (ref >60)
Glucose, Bld: 105 mg/dL — ABNORMAL HIGH (ref 70–99)
Potassium: 3.5 mmol/L (ref 3.5–5.1)
Sodium: 139 mmol/L (ref 135–145)

## 2020-09-02 LAB — CBC
HCT: 46.8 % (ref 39.0–52.0)
Hemoglobin: 16.6 g/dL (ref 13.0–17.0)
MCH: 30.5 pg (ref 26.0–34.0)
MCHC: 35.5 g/dL (ref 30.0–36.0)
MCV: 85.9 fL (ref 80.0–100.0)
Platelets: 272 10*3/uL (ref 150–400)
RBC: 5.45 MIL/uL (ref 4.22–5.81)
RDW: 13.1 % (ref 11.5–15.5)
WBC: 10 10*3/uL (ref 4.0–10.5)
nRBC: 0 % (ref 0.0–0.2)

## 2020-09-02 LAB — TROPONIN I (HIGH SENSITIVITY): Troponin I (High Sensitivity): <2 ng/L (ref <18)

## 2020-09-02 NOTE — ED Provider Notes (Signed)
[facility] Emergency Department Provider Note   ____________________________________________   First MD Initiated Contact with Patient 09/02/20 1820     (approximate)  I have reviewed the triage vital signs and the nursing notes.   HISTORY  Chief Complaint Facial Pain, Near Syncope, and Irregular Heart Beat    HPI Craig Hicks is a 29 y.o. male with a stated past medical history of anxiety who presents for intermittent left-sided headache/neck pain and persistence left finger and left sole of the foot numbness over the past 2 months.  Patient states that he initially started with left finger and toe numbness 2 months prior to arrival that has been relatively stable since onset.  Patient also states that he had an episode approximately 2 weeks prior to arrival of tachycardia, chest pain, and left arm/jaw pain that began spontaneously while he was walking down the hallway.  Patient states that he had vertigo at this time as well and a sensation of presyncope that improved after sitting down on the floor.  Patient called EMS and was taken to the emergency department where "they told me I may have anxiety and gave me diclofenac and hydroxyzine".  Patient states that he has been using the diclofenac regularly with minimal improvement in his arm and neck pain.  Patient states that he initially started using the hydroxyzine intermittently but now uses it fairly constantly.  Patient denies any associated shortness of breath, abdominal pain, nausea/vomiting/diarrhea, or ambulatory dysfunction during these episodes.  Patient states that he has had intermittent worsening of his headache and neck pain over the last 2 weeks and due to increased pain came to the emergency department today.         Past Medical History:  Diagnosis Date  . Depression     There are no problems to display for this patient.   History reviewed. No pertinent surgical history.  Prior to  Admission medications   Not on File    Allergies Patient has no known allergies.  No family history on file.  Social History Social History   Tobacco Use  . Smoking status: Never Smoker  . Smokeless tobacco: Never Used  Substance Use Topics  . Alcohol use: Yes    Comment: occ  . Drug use: No    Review of Systems Constitutional: No fever/chills Eyes: No visual changes. ENT: No sore throat. Cardiovascular: Endorses intermittent chest pain. Respiratory: Denies shortness of breath. Gastrointestinal: No abdominal pain.  No nausea, no vomiting.  No diarrhea. Genitourinary: Negative for dysuria. Musculoskeletal: Negative for acute arthralgias Skin: Negative for rash. Neurological: Endorses headaches, weakness/numbness/paresthesias in left upper and lower extremity Psychiatric: Negative for suicidal ideation/homicidal ideation   ____________________________________________   PHYSICAL EXAM:  VITAL SIGNS: ED Triage Vitals  Enc Vitals Group     BP 09/02/20 1602 113/73     Pulse Rate 09/02/20 1602 77     Resp 09/02/20 1602 17     Temp 09/02/20 1602 98.8 F (37.1 C)     Temp Source 09/02/20 1602 Oral     SpO2 09/02/20 1602 98 %     Weight 09/02/20 1504 185 lb (83.9 kg)     Height 09/02/20 1504 6\' 2"  (1.88 m)     Head Circumference --      Peak Flow --      Pain Score 09/02/20 1504 2     Pain Loc --      Pain Edu? --      Excl. in  GC? --    Constitutional: Alert and oriented. Well appearing and in no acute distress. Eyes: Conjunctivae are normal. PERRL. EOMI. Head: Atraumatic. Nose: No congestion/rhinnorhea. Mouth/Throat: Mucous membranes are moist.  Oropharynx non-erythematous. Neck: No stridor Cardiovascular: Normal rate, regular rhythm. Grossly normal heart sounds.  Good peripheral circulation. Respiratory: Normal respiratory effort.  No retractions. Lungs CTAB. Gastrointestinal: Soft and nontender. No distention. No abdominal bruits. No CVA  tenderness. Musculoskeletal: No lower extremity tenderness nor edema.  No joint effusions. Neurologic:  Normal speech and language.  Reported decreased sensation in left fingertips and left sole of the foot d. No gait instability. Skin:  Skin is warm, dry and intact. No rash noted. Psychiatric: Mood and affect are normal. Speech and behavior are normal.  ____________________________________________   LABS (all labs ordered are listed, but only abnormal results are displayed)  Labs Reviewed  BASIC METABOLIC PANEL - Abnormal; Notable for the following components:      Result Value   Glucose, Bld 105 (*)    All other components within normal limits  CBC  TROPONIN I (HIGH SENSITIVITY)  TROPONIN I (HIGH SENSITIVITY)   ____________________________________________  EKG  ED ECG REPORT I, Merwyn Katos, the attending physician, personally viewed and interpreted this ECG.  Date: 09/02/2020 EKG Time: 1614 Rate: 85 Rhythm: normal sinus rhythm QRS Axis: normal Intervals: normal ST/T Wave abnormalities: normal Narrative Interpretation: no evidence of acute ischemia  ____________________________________________  RADIOLOGY  ED MD interpretation: 2 view chest x-ray shows no evidence of acute abnormalities CT without contrast of the head shows no evidence of acute abnormalities  Official radiology report(s): DG Chest 2 View  Result Date: 09/02/2020 CLINICAL DATA:  Chest pain EXAM: CHEST - 2 VIEW COMPARISON:  None. FINDINGS: The heart size and mediastinal contours are within normal limits. Both lungs are clear. The visualized skeletal structures are unremarkable. IMPRESSION: No active cardiopulmonary disease. Electronically Signed   By: Duanne Guess D.O.   On: 09/02/2020 15:42   CT Head Wo Contrast  Result Date: 09/02/2020 CLINICAL DATA:  Left-sided facial pain, left-sided body numbness EXAM: CT HEAD WITHOUT CONTRAST TECHNIQUE: Contiguous axial images were obtained from the base of  the skull through the vertex without intravenous contrast. COMPARISON:  None. FINDINGS: Brain: No acute infarct or hemorrhage. Lateral ventricles and midline structures are unremarkable. No acute extra-axial fluid collections. No mass effect. Vascular: No hyperdense vessel or unexpected calcification. Skull: Normal. Negative for fracture or focal lesion. Sinuses/Orbits: No acute finding. Other: None. IMPRESSION: 1. No acute intracranial process. Electronically Signed   By: Sharlet Salina M.D.   On: 09/02/2020 18:50    ____________________________________________   PROCEDURES  Procedure(s) performed (including Critical Care):  Procedures   ____________________________________________   INITIAL IMPRESSION / ASSESSMENT AND PLAN / ED COURSE        Patient presents for multiple complaints including a headache, neck pain, presyncopal episodes, and persistent left's fingertip and left sole of the foot numbness.  Differential diagnosis includes intracranial mass, intracranial infection, panic attack, ACS Given history, physical exam, laboratory/radiologic evaluation, patient does not show any evidence of acute abnormalities or emergent diagnoses.  Patient shows no red flag symptomatology.  Patient has been given follow-up by his primary care physician with neurology and he was encouraged to keep this plan for follow-up.  Patient is amatory without difficulty.  Patient has full strength and range of motion in all extremities.  Patient agrees with plan for discharge home and follow-up with his primary care physician as well  as neurology as soon as possible.      ____________________________________________   FINAL CLINICAL IMPRESSION(S) / ED DIAGNOSES  Final diagnoses:  Near syncope  Intermittent headache  Neck pain on left side  Numbness and tingling of left hand  Numbness of left foot     ED Discharge Orders    None       Note:  This document was prepared using Dragon voice  recognition software and may include unintentional dictation errors.   Merwyn Katos, MD 09/02/20 2002

## 2020-09-02 NOTE — ED Triage Notes (Signed)
Pt reports had some pain to the left side of his face, and numbness to the elft side of his body. Pt states that he was seen and treated and they thought it was a pinched nerve but the symptoms are still there. Pt reports this am he woke up feeling like his heart was beating fast and ahrd

## 2020-09-02 NOTE — ED Notes (Signed)
Patient transferred from wheelchair to stretcher independently and easily.

## 2020-09-06 DIAGNOSIS — F411 Generalized anxiety disorder: Secondary | ICD-10-CM | POA: Diagnosis not present

## 2020-09-06 DIAGNOSIS — R519 Headache, unspecified: Secondary | ICD-10-CM | POA: Diagnosis not present

## 2020-09-06 DIAGNOSIS — R5383 Other fatigue: Secondary | ICD-10-CM | POA: Diagnosis not present

## 2020-09-06 MED ORDER — PAROXETINE 20 MG TABLET
0.00000 days
Start: 2020-09-06 — End: ?

## 2020-09-09 DIAGNOSIS — F411 Generalized anxiety disorder: Secondary | ICD-10-CM | POA: Insufficient documentation

## 2020-09-09 NOTE — Unmapped (Unsigned)
Medical Case Management Social Work Note     Duration of Intervention: 15 min     REASON/TYPE OF CONTACT: Phone, Patient called because he is having concerns with physical and mental health    ASSESSMENT: Patient reports that for about 3 to 4 months he has been having symptoms of tingling in his arms, light headedness, bad nauseau, painful headaches and fast heart rate. Patient reports that initially he thought it was a pinched nerve in his neck but that he has had to go to the emergency room a couple of times and they have not been able to diagnose him with anything. Patient reported that he believes they are putting in a referral to neurology for ongoing MRI and diagnosis but that it has been a week and he has not been called about an appropriate referral. Patient reports that he believes some of what is going on may be anxiety related due to not knowing what is going on with his physical health. Patient prescribed anxiety medication by his primary care physician and he feels like the fast heart rate and worrying has decreased slightly since taking the medication. Patient is seeking to be connected to both a mental health therapist and a neurologist to continue to get the care he needs.     ADHERENCE: Did not discuss at this interaction due to competing priorities and/or no concerns.    INTERVENTION:  SW provided supportive counseling and next steps to help patient improve health.  SW problem solved ways to help get connection to care.     PLAN:  SW will message patient provider, Miki Kins, to help with a referral to neurology.  SW will begin to look up mental health therapist that will accept his insurance for ongoing support. SW will reach out to patient by the end of day on Friday with updates about connection to care.     Calaya Gildner LCSW, LCAS  [facility] Social Work   Direct: [phone]

## 2020-09-14 ENCOUNTER — Encounter
Admit: 2020-09-14 | Discharge: 2020-09-15 | Payer: PRIVATE HEALTH INSURANCE | Attending: Infectious Disease | Primary: Infectious Disease

## 2020-09-14 DIAGNOSIS — F329 Major depressive disorder, single episode, unspecified: Secondary | ICD-10-CM | POA: Diagnosis not present

## 2020-09-14 DIAGNOSIS — F419 Anxiety disorder, unspecified: Secondary | ICD-10-CM | POA: Diagnosis not present

## 2020-09-14 DIAGNOSIS — Z6823 Body mass index (BMI) 23.0-23.9, adult: Secondary | ICD-10-CM | POA: Diagnosis not present

## 2020-09-14 DIAGNOSIS — B20 Human immunodeficiency virus [HIV] disease: Secondary | ICD-10-CM | POA: Diagnosis not present

## 2020-09-14 NOTE — Unmapped (Signed)
Doing well with complaints as above  No missed doses or medication related side effects  Continue current regimen of: BIKTARVY  Check viral load, CD4 and other safety labs at next visit  Unlikely that biktarvy is cause of his constellation of symptoms but integrase inhibitors can contribute to neuropsych symptoms. Will keep an eye on things and adjust if other causes cannot be found  Follow up as directed

## 2020-09-14 NOTE — Unmapped (Unsigned)
Medical Case Management Social Work Note     Duration of Intervention: 15 min     REASON/TYPE OF CONTACT: Face to Face , SW followed up about mental health needs and recent physical symptoms.     ASSESSMENT: Patient reports feeling slightly better than a couple of weeks ago and ability to do simple tasks throughout the day. Patient continues to report severe nerve pain. Patient discussed that he takes breaks and stops what he is doing when he experiences pain and usually symptoms go away enough to proceed activity. Patient connected with primary care and they put in neurology referral. Patient agreed to call PCP again and ask about update with referral.  Patient also reports that he was connected with Oscar La, therapist, through [facility], and is currently deciding if he would like to be connected. Patient also has opportunity through work to get connected with therapist. Patient agreed to keep this social worker informed about progress with getting connected with neurology and mental health provider.     ADHERENCE: Reports good adherence with no barriers.    INTERVENTION:  SW followed up about mental health referral and next steps for neurology referral.     PLAN:  Patient will reach out with concerns and further support when needed.     Raianna Slight LCSW, LCAS  [facility] Social Work   Direct: [phone]

## 2020-09-14 NOTE — Unmapped (Signed)
Return Patient Note    Austin Thompson is a 29 y.o. male (DOB: 1991-02-13) who is seen in follow-up     Assessment/Plan:     HIV disease (CMS-HCC)  Doing well with complaints as above  No missed doses or medication related side effects  Continue current regimen of: BIKTARVY  Check viral load, CD4 and other safety labs at next visit  Unlikely that biktarvy is cause of his constellation of symptoms but integrase inhibitors can contribute to neuropsych symptoms. Will keep an eye on things and adjust if other causes cannot be found  Follow up as directed    Anxiety and depression  I agree with work up that is being led by his primary care provider  Would consider also referring for ziopatch- think arrythmia is unlikely but might capture panic attacks as well  Agree with paxil- may need increased dose or other anxiolytic  Agree with referral to mental health provider for counseling as well      COVID Education:  - Discussed the current COVID pandemic and strategies to avoid infection and what to do if the patient becomes symptomatic.  - Discussed use of masks, vaccines, social distancing, symptoms, etc.      Prevention Education     -Discussed U=U and Stressed importance of adherence and viral suppression       -Discussed disclosure of HIV status with sex partners, PrEP, condom use and STIs      Sexual health  See bottom of note for most recent test dates and results.  ?? GC/CT NAATs -- not needed to be checked today  ?? RPR -- not needed to be checked today  ?? Anal pap -- not needed to be checked today      Educational and counseling services took 30 minutes of today's visit time.    Total visit lasted 45 minutes     Follow Up:  Return to clinic at the next regular appointment or sooner if needed.     Return in about 3 months (around 12/14/2020).  Future Appointments   Date Time Provider Department Center   12/14/2020  8:00 AM Barkley Bruns, MD [facility]           Subjective:      Austin Thompson is a 29 y.o. male (DOB: [birth year])  who is seen in follow-up regarding multiple complaints.     HPI:     Patient had called clinic late last week with multiple symptoms - vertigo, palpitations, tingling, headaches, acid reflux and anxiety. Felt best to evaluate in person rather than calling so advised to come to clinic for evaluation today.  Briefly- as described in prior notes, patient has history of anxiety and panic attacks for which he had been treated with meds in past but none recently. Around time diagnosed with HIV began to get increasing panic attacks as well as other symptoms of numbness and tingling down left side of body and headaches. When I saw him in august he was actually doing well and thought maybe things improving. However after that visit, his symptoms got worse. Specifically:  8/18- had panic attack at work and ems called. Evaluated for cardiac issues but felt ok and released home  9/9 - had near syncope at home and brought to [facility] ed for evaluation by ems. ekg -, cxr -, head ct -, troponin -. Sent home  Throughout all of this followed closely by his primary care provider- cheryl lindley (preferred primary care in  [place])  She has tried hydroxyzine (anxiety) and prednisone dose pack (? Pinched nerve in neck).  She did a neck xray which was reportedly neg and has referred him to neurology.  She started him on famotidine for acid reflux and paxil 20mg  (8 days ago).  He is planning on returning to see her next week.        See above for full review by problem    HIV   no missed doses   no medication side effects    Review of Systems:     Pertinent items are noted in HPI.      Current Meds:  Current Outpatient Medications   Medication Sig Dispense Refill   ??? bictegrav-emtricit-tenofov ala (BIKTARVY) 50-200-25 mg tablet Take 1 tablet by mouth daily. 30 tablet 11   ??? fexofenadine (ALLEGRA) 180 MG tablet Take 180 mg by mouth daily.     ??? PARoxetine (PAXIL) 20 MG tablet TAKE 1 TABLET BY MOUTH EVERY DAY IN THE MORNING       No current facility-administered medications for this visit.       Allergies: Patient has no known allergies.    History: I reviewed Austin Thompson's medical and surgical history and updated as appropriate.    No past medical history on file.    No past surgical history on file.    Family History:  I have reviewed past family history with no new findings since the last clinic appointment dated    No family history on file.    Social History  General     Living situation      In school      Employment           Sexual      Relationship      Number partners      Oral sex   .     Anal sex   .     Vaginal sex   .          Substance Use     Alcohol      Tobacco      Marijuana      Illicits   .     IDU            Objective:      Physical Exam:    BP 117/80  - Pulse 75  - Temp 37.1 ??C (98.7 ??F)  - Resp 12  - Ht 188 cm (6' 2)  - Wt 82.7 kg (182 lb 6.4 oz)  - BMI 23.42 kg/m??     GEN:  looks well, no apparent distress  HEENT: PERRL, EOMI, no thrush, lesions or exudate  CV:RRR, no m/r/g, S1/S2  PULM:CTAB ant/post, normal work of breathing  ZOX:WRUE, NTND, no masses  AV:[id number]  RECTAL:deferred  SKIN:no petechiae, ecchymoses or obvious rashes on clothed exam  NEURO:grossly nonfocal  PSYCH:attentive, appropriate affect, good eye contact, fluent speech    Laboratory:    I have reviewed all recent lab results including:    Results for orders placed or performed in visit on 08/10/20   Vitamin B12 (Cobalamin)   Result Value Ref Range    Vitamin B-12 598 211 - 911 pg/ml   Folate   Result Value Ref Range    Folate 15.6 >=5.4 ng/mL   Metabolic Panel, Basic   Result Value Ref Range    Sodium 141 135 - 145 mmol/L    Potassium 3.8 3.4 - 4.5 mmol/L  Chloride 106 98 - 107 mmol/L    CO2 30.5 20.0 - 31.0 mmol/L    Anion Gap 5 5 - 14 mmol/L    BUN 11 9 - 23 mg/dL    Creatinine 1.61 0.96 - 1.10 mg/dL    BUN/Creatinine Ratio 12     EGFR CKD-EPI Non-African American, Male >90 >=60 mL/min/1.53m2    EGFR CKD-EPI African American, Male >90 >=60 mL/min/1.64m2    Glucose 92 70 - 179 mg/dL    Calcium 04.5 8.7 - 40.9 mg/dL   HIV RNA, Quantitative, PCR   Result Value Ref Range    HIV RNA Quant Result Detected (A) Not Detected    HIV RNA <40 (H) <0 copies/mL    HIV RNA Log(10)      HIV RNA Comment     Bilirubin, total   Result Value Ref Range    Total Bilirubin 0.6 0.3 - 1.2 mg/dL   AST   Result Value Ref Range    AST 19 <=34 U/L   ALT   Result Value Ref Range    ALT 14 10 - 49 U/L   LYMPH MARKER LIMITED,FLOW   Result Value Ref Range    CD3% (T Cells) 76 61 - 86 %    Absolute CD3 Count 1,216 915-3,400 /uL    CD4% (T Helper) 33 (L) 34 - 58 %    Absolute CD4 Count 528 510-2,320 /uL    CD8% T Suppressor 40 (H) 12 - 38 %    Absolute CD8 Count 640 180-1,520 /uL    CD4:CD8 Ratio 0.8 (L) 0.9 - 4.8   CBC w/ Differential   Result Value Ref Range    WBC 4.3 3.5 - 10.5 10*9/L    RBC 5.28 4.32 - 5.72 10*12/L    HGB 15.8 13.5 - 17.5 g/dL    HCT 81.1 91.4 - 78.2 %    MCV 87.8 81.0 - 95.0 fL    MCH 30.0 26.0 - 34.0 pg    MCHC 34.2 30.0 - 36.0 g/dL    RDW 95.6 21.3 - 08.6 %    MPV 7.6 7.0 - 10.0 fL    Platelet 225 150 - 450 10*9/L    Neutrophils % 47.2 %    Lymphocytes % 37.7 %    Monocytes % 10.2 %    Eosinophils % 3.7 %    Basophils % 1.2 %    Absolute Neutrophils 2.0 1.7 - 7.7 10*9/L    Absolute Lymphocytes 1.6 0.7 - 4.0 10*9/L    Absolute Monocytes 0.4 0.1 - 1.0 10*9/L    Absolute Eosinophils 0.2 0.0 - 0.7 10*9/L    Absolute Basophils 0.1 0.0 - 0.1 10*9/L             Sexual Health & Screening Data  No results found for: LABRPR, RPRTIT, GCNAA, GCSOR, CTNAA, CTSOR      There is no immunization history on file for this patient.

## 2020-09-14 NOTE — Unmapped (Signed)
Your three most recent CD4 T-cell counts are below.   Here are a few things to keep in mind when looking at your numbers:   ??? For most people, we're checking CD4 counts once or twice a year.  ??? It's normal for your CD4 count to be different from visit to visit. Please let us know if you have any questions.      Lab Results   Component Value Date    Absolute CD4 Count 528 08/10/2020    Absolute CD4 Count 540 06/22/2020     Lab Results   Component Value Date    HIV RNA <40 (H) 08/10/2020    HIV RNA 35,300 (H) 06/22/2020        GENERAL INSTRUCTIONS   -- clinic phone number is [phone]  -- return to clinic in 3 months   -- if need sooner appointment, please contact clinic   -- if need urgent assistance, please call 911 or go to ED.

## 2020-09-14 NOTE — Unmapped (Signed)
I agree with work up that is being led by his primary care provider  Would consider also referring for ziopatch- think arrythmia is unlikely but might capture panic attacks as well  Agree with paxil- may need increased dose or other anxiolytic  Agree with referral to mental health provider for counseling as well

## 2020-09-14 NOTE — Unmapped (Unsigned)
PROMIS Tablet Screening  Completed Date: 09/14/2020     SW reviewed self-administered screening.    Patient had a PHQ-9 score of 12  indicating Moderate depression.   Pt denies SI.   Pt scored 4 on AUDIT/AUDIT-C indicating At-risk drinking   Pt  endorses substance use in past 3 months. Inhalents, Marijuana  Pt denies concerns for IPV.    Pt seen for regular ID visit.    Aneta Hendershott, LCSW, LCAS

## 2020-09-21 DIAGNOSIS — R5383 Other fatigue: Secondary | ICD-10-CM | POA: Diagnosis not present

## 2020-09-21 DIAGNOSIS — F411 Generalized anxiety disorder: Secondary | ICD-10-CM | POA: Diagnosis not present

## 2020-09-21 DIAGNOSIS — F329 Major depressive disorder, single episode, unspecified: Secondary | ICD-10-CM | POA: Diagnosis not present

## 2020-09-23 NOTE — Unmapped (Signed)
[facility] Refill Coordination Note    Specialty Medication(s) to be Shipped:   Infectious Disease: Biktarvy    Other medication(s) to be shipped: No additional medications requested for fill at this time     Austin Thompson, DOB: 09-Apr-1991  Phone: [phone] (home)       All above HIPAA information was verified with patient.     Was a Nurse, learning disability used for this call? No    Completed refill call assessment today to schedule patient's medication shipment from the [facility] [phone]).       Specialty medication(s) and dose(s) confirmed: Regimen is correct and unchanged.   Changes to medications: Zarek reports starting the following medications: paroxetine   Changes to insurance: No  Questions for the pharmacist: No    Confirmed patient received Welcome Packet with first shipment. The patient will receive a drug information handout for each medication shipped and additional FDA Medication Guides as required.       DISEASE/MEDICATION-SPECIFIC INFORMATION        N/A    SPECIALTY MEDICATION ADHERENCE     Medication Adherence    Patient reported X missed doses in the last month: 0  Specialty Medication: biktarvy 50-200-25mg   Patient is on additional specialty medications: No  Informant: patient  Reliability of informant: reliable  Patient is at risk for Non-Adherence: No                biktarvy 50-200-25 mg: 7 days of medicine on hand          SHIPPING     Shipping address confirmed in Epic.     Delivery Scheduled: Yes, Expected medication delivery date: 10/04.     Medication will be delivered via UPS to the prescription address in Epic WAM.    Antonietta Barcelona   [facility] Specialty Technician

## 2020-09-24 MED FILL — BIKTARVY 50 MG-200 MG-25 MG TABLET: ORAL | 30 days supply | Qty: 30 | Fill #1

## 2020-09-24 MED FILL — BIKTARVY 50 MG-200 MG-25 MG TABLET: 30 days supply | Qty: 30 | Fill #1 | Status: AC

## 2020-10-05 DIAGNOSIS — R42 Dizziness and giddiness: Principal | ICD-10-CM

## 2020-10-05 DIAGNOSIS — G43809 Other migraine, not intractable, without status migrainosus: Principal | ICD-10-CM

## 2020-10-20 ENCOUNTER — Ambulatory Visit
Admit: 2020-10-20 | Discharge: 2020-10-21 | Payer: PRIVATE HEALTH INSURANCE | Attending: Student in an Organized Health Care Education/Training Program | Primary: Student in an Organized Health Care Education/Training Program

## 2020-10-20 DIAGNOSIS — R519 Headache, unspecified: Secondary | ICD-10-CM | POA: Diagnosis not present

## 2020-10-20 DIAGNOSIS — R202 Paresthesia of skin: Secondary | ICD-10-CM | POA: Diagnosis not present

## 2020-10-20 DIAGNOSIS — F411 Generalized anxiety disorder: Secondary | ICD-10-CM | POA: Diagnosis not present

## 2020-10-20 DIAGNOSIS — R42 Dizziness and giddiness: Secondary | ICD-10-CM | POA: Diagnosis not present

## 2020-10-20 DIAGNOSIS — R5383 Other fatigue: Secondary | ICD-10-CM | POA: Diagnosis not present

## 2020-10-20 DIAGNOSIS — G44209 Tension-type headache, unspecified, not intractable: Secondary | ICD-10-CM | POA: Diagnosis not present

## 2020-10-20 DIAGNOSIS — R200 Anesthesia of skin: Secondary | ICD-10-CM | POA: Diagnosis not present

## 2020-10-20 DIAGNOSIS — F329 Major depressive disorder, single episode, unspecified: Secondary | ICD-10-CM | POA: Diagnosis not present

## 2020-10-20 DIAGNOSIS — E785 Hyperlipidemia, unspecified: Secondary | ICD-10-CM | POA: Diagnosis not present

## 2020-10-20 DIAGNOSIS — R6882 Decreased libido: Secondary | ICD-10-CM | POA: Diagnosis not present

## 2020-10-20 NOTE — Unmapped (Signed)
Outpatient Neurology Consult Note     [facility] [address]  [facility] [address] [place]  [address]  [place] [address]  [phone]    Date: 10/20/2020  Patient Name: Austin Thompson  MRN: [id number]  PCP: Karna Dupes*     Assessment and Plan        Austin Thompson is a 29 y.o. male with a past medical history of recent diagnosis of HIV (June 2021) on [place] and Anxiety presenting in consultation for evaluation of multiple neurologic complains including dizziness, left sided headaches, left sided arm numbness and tingling (intermittent in UE/persistent in LE) as well as tongue tingling and numbness. Atypical transient episodes that are occurring daily up to multiple times a day. Neurologic exam remarkable for decrease vibration in bilateral foot to the ankle and decrease pinprick at the plantar surface of the left foot.  Although his neurologic exam is relatively unremarkable in comparison to the symptoms, given his recent diagnosis of HIV and current CD4 count (~500) we need to consider the possibility for myelopathic related process driving his transient symptoms. Of note, his viral levels currently are undetected and he is not showing other associated symptoms.  Another possibility for his new onset symptoms could be secondary to medication side effect as Austin Thompson has been associated with dizziness, headache and insomnia. Lastly in the differential we include progression of panic attacks but will rule out stated conditions first.  With this in mind,  we plan to obtain an MRI brain and cervical spine with and without contrast.  We will also order a lumbar puncture. See studies below. If findings are unremarkable we will consider extending imaging to lumbar and thoracic spine to rule out radiculopathy given his decrease vibratory sensation findings.  With regards to his dizziness, a Zio patch will also order to rule out cardiac/vasovagal related conditions.  Follow-up in 8 weeks.    -- MRI Brain/C spine wwo contrast.  -- Plan for lumbar puncture with CSF analysis x2, cytology, heme path, CSF glucose, protein, OCB panel (CSF and Serum), HHV-6 PCR, VZV PCR, AFB culture CSF  --Return Visit Discussed: Return in about 8 weeks (around 12/15/2020).    This patient was discussed with Dr. Raenette Rover who agrees with the above assessment and plan.     Reviewed the differential diagnosis, plan of care in detail, as well as other elements as detailed above.  A written summary was provided to the patient, including a current medication and allergy list, problem list and plan of care. In addition, my contact information was provided to the patient, in writing.  All the patient's questions and concerns were addressed to their stated satisfaction .      I personally spent 75 minutes face-to-face and non-face-to-face in the care of this patient, which includes all pre, intra, and post visit time on the date of service.    Ces S Salda??a, MD  PGY-III Neurology Resident .       HPI         HPI: Mr. Austin Thompson is a 29 y.o. right handed male seen in consultation at the [facility] at the request of Dr. Clint Guy for evaluation of multiple neurologic complains They are requesting my opinion regarding potential etiologies, diagnostic work-up that should be considered, and/or treatment options entertained.     Reports onset of left sided headache and headpain that began in august 2021. He reports pain  is localized to the left side of the head. He reports multiple sensations from stabbing, headband like pain, and tensing and heaviness of the head. headpain/headache is associated with lightheadedness and disorientation. Reports it happens from one time a day to multiple times a days. Reports they occur between minutes to hours with some times lasting the entire day.     Numbness of the foot since march of 2021 with associated tingling. Reports pad of his foot is numb 24hrs day. No worsening since march. He reports only the plantar part of his foot is numb. Does endorse wearing heave steel toe boots. No exacerbating or relieving factors. He reports he tends not to notice when he's is doing something else. Wakes up with the numbness on the foot.  No changes in position.  With regards to the numbness on the left arm, he reports intermittent numb prinkling sensation and muscle tremor with associated intermittent pain from the left hand to the left shoulder. Occurs more than once a day (on average 2 times a day). In between episodes is back to baseline. He notes this occur with the head pain, noting it could be a precursor to the head pain but sometimes it also occurs after.     During this episodes he also endorses chest pain. No sweating with the episodes but does endorse lightheadedness as above. He also endorses blurry vision and fogginess to vision in the left eye for the past 6 months. Saw an ophthalmology in march for a sinus infection but has not been back.     Also endorses tongue tingling that occurs intermittent as above and associated with all other symptoms     Reports light and noise sensitivity all the time.     Reports whole head headache occurring once a week. Hes been prescribed diclofenac as needed. They help the headache but dont resolve fully the headache.     Diagnosed HIV positive earlier. Folate (15.6) and vitamin b12 (598).    No Known Allergies     Current Outpatient Medications   Medication Sig Dispense Refill   ??? bictegrav-emtricit-tenofov ala (BIKTARVY) 50-200-25 mg tablet Take 1 tablet by mouth daily. 30 tablet 11   ??? fexofenadine (ALLEGRA) 180 MG tablet Take 180 mg by mouth daily.     ??? PARoxetine (PAXIL) 20 MG tablet TAKE 1 TABLET BY MOUTH EVERY DAY IN THE MORNING       No current facility-administered medications for this visit.       No past medical history on file.    No past surgical history on file.    Social History Socioeconomic History   ??? Marital status: None     Spouse name: None   ??? Number of children: None   ??? Years of education: None   ??? Highest education level: None   Occupational History   ??? None   Tobacco Use   ??? Smoking status: Never Smoker   ??? Smokeless tobacco: Never Used   Substance and Sexual Activity   ??? Alcohol use: Yes     Comment: occasionally   ??? Drug use: Not Currently   ??? Sexual activity: None   Other Topics Concern   ??? None   Social History Narrative   ??? None     Social Determinants of Health     Financial Resource Strain:    ??? Difficulty of Paying Living Expenses:    Food Insecurity:    ??? Worried About Programme researcher, broadcasting/film/video in the  Last Year:    ??? Ran Out of Food in the Last Year:    Transportation Needs:    ??? Lack of Transportation (Medical):    ??? Lack of Transportation (Non-Medical):    Physical Activity:    ??? Days of Exercise per Week:    ??? Minutes of Exercise per Session:    Stress:    ??? Feeling of Stress :    Social Connections:    ??? Frequency of Communication with Friends and Family:    ??? Frequency of Social Gatherings with Friends and Family:    ??? Attends Religious Services:    ??? Database administrator or Organizations:    ??? Attends Banker Meetings:    ??? Marital Status:        No family history on file.     Review of Systems     A 10-system review of systems was conducted and was negative except as documented above in the HPI.       Objective        Vital signs: BP 116/67 (BP Site: L Arm, BP Position: Sitting, BP Cuff Size: Medium)  - Pulse 69  - Ht 188 cm (6' 2)  - Wt 89.9 kg (198 lb 4.8 oz)  - BMI 25.46 kg/m??      Physical Exam:  General Appearance: well appearing. Normal skin color, afebrile.  Heart and lungs: Warm and well perfused. Normal work of breathing. Abdomen: Soft, non-tender. No peripheral  edema, peripheral pulses palpable.       NEUROLOGICAL EXAMINATION:   General:  Alert and oriented to person, place, time and situation.    Recent and remote memory intact. Attention span and concentration normal.    Language and spontaneous speech normal, no dysarthria or aphasia.   Fund of knowledge normal.        Cranial Nerves:   II, III- Pupils are equal and reactive to light b/l (direct and consensual reactions). No visual field defect. No red light desaturations  III, IV, VI- extra ocular movements are intact, No ptosis, no diplopia, no nystagmus.  V- sensation of the face intact b/l.   VII- face symmetrical, no facial droop, normal facial movements with smile/grimace  VIII- Hearing grossly intact.   IX and X- symmetric palate contraction, no dysarthria, no dysphagia.  XI- Full shoulder shrug bilaterally; no wasting, normal tone and strength of sternocleidomastoid muscles bilaterally.  XII- No tongue atrophy, no tongue fasciculations; tongue protrudes midline, full range of movements of the tongue.    Neck: flexion normal.    Motor Exam:   Normal bulk. Fasciculations not observed.   Muscle strength:  Muscles UEs  LEs    R L  R L   Deltoids 5/5 5/5 Hip flexors  5/5 5/5   Biceps 5/5 5/5 Hip extensors 5/5 5/5   Triceps 5/5 5/5 Knee flexors 5/5 5/5   Hand grip 5/5 5/5 Knee extensors 5/5 5/5   Wrist flexors 5/5 5/5 Foot dorsal flexors 5/5 5/5   Wrist extensors 5/5 5/5 Foot plantar flexors 5/5 5/5                    Position test UE: no pronator drift b/l.     Reflexes R L   Biceps +3 +3   Brachioradialis  +3 +3   Triceps +3 +3   Patella +3 +3   Achilles +3 +3   Normal tone b/l. Negative Babinski sign bilaterally.  Sensory system:  - Superficial light touch sensation: wnl BL UE/LE  - Vibration sense: decrease vibration in a length dependent manner to the ankles otherwise normal   - Pinprick test for pain sensation: decrease in plantar side of left foot  - Temperature: wnl BL UE/LE  (WNL= within normal limits; UE= upper extremities; LE= lower extremities;       R= right, L= left).    Cerebellar/Coordination:  Rapid alternating movements, finger-to-nose and heel-to-shin  bilaterally demonstrate no abnormalities. No limb ataxia bilaterally. No gait ataxia.    Gait:   Normal stride, base and  armswing. Able to walk on toes, heels without difficulty.          Diagnostic Studies and Review of Records   Labs:  All pertinent labs review    Imaging:  All pertinent images review

## 2020-10-20 NOTE — Unmapped (Addendum)
You were seen at the Neurology Outpatient clinic for follow up on your headache and dizziness    During this visit we made the following recommendations,   We recommend obtaining images to evaluate your brain and spine given since you continue to have intermittent headaches     You will receive a call to schedule a lumbar puncture. We will send studies once we collected the fluid.    We also ordered a ziopatch to evaluate for lightheadedness.     Please follow up in 6-8 weeks    If you need further assistance please call our office at [phone] or you can send me a private message through MyChart.     Thanks,   Monika Salk??a, MD  PGY-III Neurology Resident

## 2020-10-21 DIAGNOSIS — R5381 Other malaise: Secondary | ICD-10-CM | POA: Diagnosis not present

## 2020-10-21 DIAGNOSIS — E559 Vitamin D deficiency, unspecified: Secondary | ICD-10-CM | POA: Diagnosis not present

## 2020-10-21 DIAGNOSIS — E7800 Pure hypercholesterolemia, unspecified: Secondary | ICD-10-CM | POA: Diagnosis not present

## 2020-10-21 DIAGNOSIS — R946 Abnormal results of thyroid function studies: Secondary | ICD-10-CM | POA: Diagnosis not present

## 2020-10-21 DIAGNOSIS — B20 Human immunodeficiency virus [HIV] disease: Secondary | ICD-10-CM | POA: Diagnosis not present

## 2020-10-21 DIAGNOSIS — R5383 Other fatigue: Secondary | ICD-10-CM | POA: Diagnosis not present

## 2020-10-21 NOTE — Unmapped (Signed)
[facility] Refill Coordination Note    Specialty Medication(s) to be Shipped:   Infectious Disease: Biktarvy    Other medication(s) to be shipped: No additional medications requested for fill at this time     Broderic Bara, DOB: 09/07/1991  Phone: [phone] (home)       All above HIPAA information was verified with patient.     Was a Nurse, learning disability used for this call? No    Completed refill call assessment today to schedule patient's medication shipment from the [facility] [phone]).       Specialty medication(s) and dose(s) confirmed: Regimen is correct and unchanged.   Changes to medications: Olamide reports no changes at this time.  Changes to insurance: No  Questions for the pharmacist: No    Confirmed patient received Welcome Packet with first shipment. The patient will receive a drug information handout for each medication shipped and additional FDA Medication Guides as required.       DISEASE/MEDICATION-SPECIFIC INFORMATION        N/A    SPECIALTY MEDICATION ADHERENCE     Medication Adherence    Patient reported X missed doses in the last month: 0  Specialty Medication: biktarvy 50-200-25mg   Patient is on additional specialty medications: No  Patient is on more than two specialty medications: No  Any gaps in refill history greater than 2 weeks in the last 3 months: no  Demonstrates understanding of importance of adherence: yes  Informant: patient  Reliability of informant: reliable  Provider-estimated medication adherence level: good  Patient is at risk for Non-Adherence: No                biktarvy 50-200-25 mg: 10 days of medicine on hand         SHIPPING     Shipping address confirmed in Epic.     Delivery Scheduled: Yes, Expected medication delivery date: 11/02.     Medication will be delivered via Next Day Courier to the prescription address in Epic WAM.    Antonietta Barcelona   [facility] Specialty Technician

## 2020-10-25 MED FILL — BIKTARVY 50 MG-200 MG-25 MG TABLET: ORAL | 30 days supply | Qty: 30 | Fill #2

## 2020-10-25 MED FILL — BIKTARVY 50 MG-200 MG-25 MG TABLET: 30 days supply | Qty: 30 | Fill #2 | Status: AC

## 2020-10-28 NOTE — Unmapped (Signed)
ATTESTATION NOTE:  I discussed the patient's case with the resident. I reviewed the resident???s note and agree with the resident???s findings and plan as documented in their note.   Tawanna Cooler, MD

## 2020-11-02 NOTE — Unmapped (Signed)
Pt. Called stating that he is switching from [employer] to [employer], and [place] is not covered. Pt. Request a change in regimen due to change of insurance.    Message has been  forwarded to Benefits Counselor and Provider  for recommendations.

## 2020-11-03 ENCOUNTER — Ambulatory Visit: Admit: 2020-11-03 | Discharge: 2020-11-04 | Payer: PRIVATE HEALTH INSURANCE

## 2020-11-03 DIAGNOSIS — R202 Paresthesia of skin: Secondary | ICD-10-CM | POA: Diagnosis not present

## 2020-11-03 DIAGNOSIS — G44209 Tension-type headache, unspecified, not intractable: Secondary | ICD-10-CM | POA: Diagnosis not present

## 2020-11-03 DIAGNOSIS — R200 Anesthesia of skin: Secondary | ICD-10-CM | POA: Diagnosis not present

## 2020-11-03 DIAGNOSIS — R519 Intractable episodic headache, unspecified headache type: Principal | ICD-10-CM

## 2020-11-03 MED ADMIN — gadobenate dimeglumine (MULTIHANCE) 529 mg/mL (0.1mmol/0.2mL) solution 18 mL: 18 mL | INTRAVENOUS | @ 19:00:00 | Stop: 2020-11-03

## 2020-11-05 NOTE — Unmapped (Signed)
Error

## 2020-11-05 NOTE — Unmapped (Signed)
Called pt per nurse request. Spoke with pt who said that he is re-enrolling for a plan for next year through work and that he came across a plan that he things its a good option but that it doesn't cover biktarvy but provides a list of alternative medications it does cover. Pt wants to know if any of those medications would be a good alternative to biktarvy. Informed pt that if he sent me this list I could send it to his provider so that she can review and provide suggestions. Provided him with my email information.  Pt shared that he does have an option to enroll in the same plan if it came to it.     Sherene Sires  Benefits Coordinator [facility]    Time Duration of intervention in minutes: 15 mins

## 2020-11-12 NOTE — Unmapped (Signed)
Called patient regarding MRI results. Normal findings. Patient has not had the LP yet.

## 2020-11-12 NOTE — Unmapped (Signed)
Patient left vm on nurse triage line. Asking if you have received the results from his MRI. Patient can be reached at [phone].

## 2020-11-13 DIAGNOSIS — R42 Dizziness and giddiness: Secondary | ICD-10-CM | POA: Diagnosis not present

## 2020-11-15 DIAGNOSIS — R42 Dizziness and giddiness: Principal | ICD-10-CM

## 2020-11-15 NOTE — Unmapped (Signed)
Released monitor order

## 2020-11-16 DIAGNOSIS — R42 Dizziness and giddiness: Secondary | ICD-10-CM | POA: Diagnosis not present

## 2020-11-24 NOTE — Unmapped (Signed)
[facility] Refill Coordination Note    Specialty Medication(s) to be Shipped:   Infectious Disease: Biktarvy    Other medication(s) to be shipped: No additional medications requested for fill at this time     Austin Thompson, DOB: 14-Jun-1991  Phone: [phone] (home)       All above HIPAA information was verified with patient.     Was a Nurse, learning disability used for this call? No    Completed refill call assessment today to schedule patient's medication shipment from the [facility] [phone]).       Specialty medication(s) and dose(s) confirmed: Regimen is correct and unchanged.   Changes to medications: Austin Thompson reports no changes at this time.  Changes to insurance: No  Questions for the pharmacist: No    Confirmed patient received Welcome Packet with first shipment. The patient will receive a drug information handout for each medication shipped and additional FDA Medication Guides as required.       DISEASE/MEDICATION-SPECIFIC INFORMATION        N/A    SPECIALTY MEDICATION ADHERENCE     Medication Adherence    Patient reported X missed doses in the last month: 0  Specialty Medication: BIKTARVY 50-200-25MG   Patient is on additional specialty medications: No  Patient is on more than two specialty medications: No        Biktarvy 50-200-25 mg: 4 days of medicine on hand     SHIPPING     Shipping address confirmed in Epic.     Delivery Scheduled: Yes, Expected medication delivery date: 11/26/2020.     Medication will be delivered via UPS to the prescription address in Epic WAM.    Austin Thompson [facility] Specialty Technician

## 2020-11-24 NOTE — Unmapped (Signed)
Patient left vm on nurse triage line. States he was told to call radiology to schedule the Lumbar Puncture, but radiology is telling him they only schedule with fluroscopy. And that he should contact the office to schedule in clinic. Please call patient, can be reached at [phone]

## 2020-11-25 MED FILL — BIKTARVY 50 MG-200 MG-25 MG TABLET: ORAL | 30 days supply | Qty: 30 | Fill #3

## 2020-11-25 MED FILL — BIKTARVY 50 MG-200 MG-25 MG TABLET: 30 days supply | Qty: 30 | Fill #3 | Status: AC

## 2020-11-26 NOTE — Unmapped (Signed)
Linda:    I will schedule Austin Thompson for LP.

## 2020-11-29 NOTE — Unmapped (Signed)
Patient left another vm on nurse triage line. Asking for a call back to update him on status of scheduling LP. Please call patient ASAP [phone].

## 2020-12-02 DIAGNOSIS — Z20822 Contact with and (suspected) exposure to covid-19: Secondary | ICD-10-CM | POA: Diagnosis not present

## 2020-12-02 DIAGNOSIS — Z03818 Encounter for observation for suspected exposure to other biological agents ruled out: Secondary | ICD-10-CM | POA: Diagnosis not present

## 2020-12-02 NOTE — Unmapped (Signed)
Linda:    I called Mr. Daniel Nones and informed him that I had called [facility]IR to schedule him an LP on 12/31/2020, 9:00 AM.

## 2020-12-14 ENCOUNTER — Ambulatory Visit
Admit: 2020-12-14 | Discharge: 2020-12-15 | Payer: PRIVATE HEALTH INSURANCE | Attending: Infectious Disease | Primary: Infectious Disease

## 2020-12-14 DIAGNOSIS — F419 Anxiety disorder, unspecified: Secondary | ICD-10-CM | POA: Diagnosis not present

## 2020-12-14 DIAGNOSIS — F32A Depression, unspecified: Secondary | ICD-10-CM | POA: Diagnosis not present

## 2020-12-14 DIAGNOSIS — B20 Human immunodeficiency virus [HIV] disease: Secondary | ICD-10-CM | POA: Diagnosis not present

## 2020-12-14 LAB — CBC W/ AUTO DIFF
BASOPHILS ABSOLUTE COUNT: 0.1 10*9/L (ref 0.0–0.1)
BASOPHILS RELATIVE PERCENT: 1.2 %
EOSINOPHILS ABSOLUTE COUNT: 0.1 10*9/L (ref 0.0–0.7)
EOSINOPHILS RELATIVE PERCENT: 3.1 %
HEMATOCRIT: 47.7 % (ref 38.0–50.0)
HEMOGLOBIN: 16.3 g/dL (ref 13.5–17.5)
LYMPHOCYTES ABSOLUTE COUNT: 1.6 10*9/L (ref 0.7–4.0)
LYMPHOCYTES RELATIVE PERCENT: 35.8 %
MEAN CORPUSCULAR HEMOGLOBIN CONC: 34.2 g/dL (ref 30.0–36.0)
MEAN CORPUSCULAR HEMOGLOBIN: 30.6 pg (ref 26.0–34.0)
MEAN CORPUSCULAR VOLUME: 89.6 fL (ref 81.0–95.0)
MEAN PLATELET VOLUME: 8.2 fL (ref 7.0–10.0)
MONOCYTES ABSOLUTE COUNT: 0.4 10*9/L (ref 0.1–1.0)
MONOCYTES RELATIVE PERCENT: 8.8 %
NEUTROPHILS ABSOLUTE COUNT: 2.3 10*9/L (ref 1.7–7.7)
NEUTROPHILS RELATIVE PERCENT: 51.1 %
NUCLEATED RED BLOOD CELLS: 0 /100{WBCs} (ref <=4)
PLATELET COUNT: 244 10*9/L (ref 150–450)
RED BLOOD CELL COUNT: 5.32 10*12/L (ref 4.32–5.72)
RED CELL DISTRIBUTION WIDTH: 12.9 % (ref 12.0–15.0)
WBC ADJUSTED: 4.5 10*9/L (ref 3.5–10.5)

## 2020-12-14 LAB — BASIC METABOLIC PANEL
ANION GAP: 5 mmol/L (ref 5–14)
BLOOD UREA NITROGEN: 12 mg/dL (ref 9–23)
BUN / CREAT RATIO: 14
CALCIUM: 10.5 mg/dL — ABNORMAL HIGH (ref 8.7–10.4)
CHLORIDE: 107 mmol/L (ref 98–107)
CO2: 29.3 mmol/L (ref 20.0–31.0)
CREATININE: 0.86 mg/dL
EGFR CKD-EPI AA MALE: >90 mL/min/{1.73_m2} (ref >=60)
EGFR CKD-EPI NON-AA MALE: >90 mL/min/{1.73_m2} (ref >=60)
GLUCOSE RANDOM: 94 mg/dL (ref 70–179)
POTASSIUM: 4.1 mmol/L (ref 3.4–4.5)
SODIUM: 141 mmol/L (ref 135–145)

## 2020-12-14 LAB — LYMPH MARKER LIMITED,FLOW
ABSOLUTE CD3 CNT: 1248 {cells}/uL (ref 915–3400)
ABSOLUTE CD4 CNT: 640 {cells}/uL (ref 510–2320)
ABSOLUTE CD8 CNT: 576 {cells}/uL (ref 180–1520)
CD3% (T CELLS)": 78 % (ref 61–86)
CD4% (T HELPER)": 40 % (ref 34–58)
CD4:CD8 RATIO: 1.1 (ref 0.9–4.8)
CD8% T SUPPRESR": 36 % (ref 12–38)

## 2020-12-14 LAB — AST: AST (SGOT): 24 U/L (ref <=34)

## 2020-12-14 LAB — SYPHILIS SCREEN: SYPHILIS RPR SCREEN: NONREACTIVE

## 2020-12-14 LAB — ALT: ALT (SGPT): 16 U/L (ref 10–49)

## 2020-12-14 LAB — BILIRUBIN, TOTAL: BILIRUBIN TOTAL: 0.7 mg/dL (ref 0.3–1.2)

## 2020-12-14 MED ORDER — BICTEGRAVIR 50 MG-EMTRICITABINE 200 MG-TENOFOVIR ALAFENAM 25 MG TABLET
ORAL_TABLET | Freq: Every day | ORAL | 11 refills | 30 days | Status: CP
Start: 2020-12-14 — End: ?
  Filled 2020-12-21: qty 30, 30d supply, fill #0

## 2020-12-14 NOTE — Unmapped (Signed)
Return Patient Note    Austin Thompson is a 29 y.o. male (DOB: [birth year]) who is seen in follow-up     Assessment/Plan:     HIV disease (CMS-HCC)  Doing well with complaints as above  No missed doses or medication related side effects  Continue current regimen of: BIKTARVY  Check viral load, CD4 and other safety labs at next visit  Unlikely that biktarvy is cause of his constellation of symptoms but integrase inhibitors can contribute to neuropsych symptoms. Will keep an eye on things and adjust if other causes cannot be found  Follow up as directed    Anxiety and depression  Has been seeing primary care provider and neurologist. To date has had head MRI, CT neck and ziopatch all neg. He thinks getting these normal results has made him overall less anxious. Has also weaned off the paxil which he thinks has helped him feel better also      COVID Education:  - Discussed the current COVID pandemic and strategies to avoid infection and what to do if the patient becomes symptomatic.  - Discussed use of masks, vaccines, social distancing, symptoms, etc.      Prevention Education     -Discussed U=U and Stressed importance of adherence and viral suppression       -Discussed disclosure of HIV status with sex partners, PrEP, condom use and STIs      Sexual health  See bottom of note for most recent test dates and results.  ?? GC/CT NAATs -- not needed to be checked today  ?? RPR -- for screening obtained today  ?? Anal pap -- not needed to be checked today      Educational and counseling services took 20 minutes of today's visit time.    Total visit lasted 30 minutes     Follow Up:  Return to clinic in 6 months or sooner if needed.     Return in about 6 months (around 06/14/2021).  Future Appointments   Date Time Provider Department Center   12/31/2020  9:00 AM NEURO TRIAGE [facility]           Subjective:      Austin Thompson is a 29 y.o. male (DOB: 21-Jan-1991)  who is seen in follow-up regarding HIV Positive/AIDS and Follow-up  .     HPI:     Patient is doing well and here for routine HIV follow-up appointment.  Since being seen patient has been following up with primary care provider and neurologist for his neuro and anxiety symptoms. Thinks he is overall doing much better and this is likely a result of getting all reassuring negative findings. He still has some lingering headaches and has noticed some weight gain. We discussed that both could be related to meds but wouldn't make change at this time given how well they are working. We discussed diet/exercise and he did not want referal to nutrition at this time. Will continue to follow.  Does have LP on 12/31/20 for final neuro evaluation though not likely related to HIV given CD4 count >500    See above for full review by problem    HIV   no missed doses   no medication side effects  No intercurrent illnesses or hospitalizations.   Has not seen any other medical providers.       Review of Systems:     Pertinent items are noted in HPI.      Current Meds:  Current Outpatient Medications  Medication Sig Dispense Refill   ??? bictegrav-emtricit-tenofov ala (BIKTARVY) 50-200-25 mg tablet Take 1 tablet by mouth daily. 30 tablet 11   ??? fexofenadine (ALLEGRA) 180 MG tablet Take 180 mg by mouth daily.     ??? PARoxetine (PAXIL) 20 MG tablet TAKE 1 TABLET BY MOUTH EVERY DAY IN THE MORNING (Patient not taking: Reported on 12/14/2020)       No current facility-administered medications for this visit.       Allergies: Patient has no known allergies.    History: I reviewed Floy's medical and surgical history and updated as appropriate.    No past medical history on file.    No past surgical history on file.    Family History:  I have reviewed past family history with no new findings since the last clinic appointment dated    No family history on file.    Social History  General     Living situation      In school      Employment           Sexual      Relationship      Number partners      Oral sex   .     Anal sex   .     Vaginal sex   .          Substance Use     Alcohol      Tobacco      Marijuana      Illicits   .     IDU            Objective:      Physical Exam:    BP 128/68  - Pulse 78  - Temp 36.7 ??C (98 ??F) (Temporal)     GEN:  looks well, no apparent distress  HEENT: PERRL, EOMI, no thrush, lesions or exudate  CV:RRR, no m/r/g, S1/S2  PULM:CTAB ant/post, normal work of breathing  ZOX:WRUE, NTND, no masses  AV:[id number]  RECTAL:deferred  SKIN:no petechiae, ecchymoses or obvious rashes on clothed exam  NEURO:grossly nonfocal  PSYCH:attentive, appropriate affect, good eye contact, fluent speech    Laboratory:    I have reviewed all recent lab results including:    Results for orders placed or performed in visit on 08/10/20   Vitamin B12 (Cobalamin)   Result Value Ref Range    Vitamin B-12 598 211 - 911 pg/ml   Folate   Result Value Ref Range    Folate 15.6 >=5.4 ng/mL   Metabolic Panel, Basic   Result Value Ref Range    Sodium 141 135 - 145 mmol/L    Potassium 3.8 3.4 - 4.5 mmol/L    Chloride 106 98 - 107 mmol/L    CO2 30.5 20.0 - 31.0 mmol/L    Anion Gap 5 5 - 14 mmol/L    BUN 11 9 - 23 mg/dL    Creatinine 4.78 2.95 - 1.10 mg/dL    BUN/Creatinine Ratio 12     EGFR CKD-EPI Non-African American, Male >90 >=60 mL/min/1.62m2    EGFR CKD-EPI African American, Male >90 >=60 mL/min/1.64m2    Glucose 92 70 - 179 mg/dL    Calcium 62.1 8.7 - 30.8 mg/dL   HIV RNA, Quantitative, PCR   Result Value Ref Range    HIV RNA Quant Result Detected (A) Not Detected    HIV RNA <40 (H) <0 copies/mL    HIV RNA Log(10)      HIV  RNA Comment     Bilirubin, total   Result Value Ref Range    Total Bilirubin 0.6 0.3 - 1.2 mg/dL   AST   Result Value Ref Range    AST 19 <=34 U/L   ALT   Result Value Ref Range    ALT 14 10 - 49 U/L   LYMPH MARKER LIMITED,FLOW   Result Value Ref Range    CD3% (T Cells) 76 61 - 86 %    Absolute CD3 Count 1,216 915-3,400 /uL    CD4% (T Helper) 33 (L) 34 - 58 %    Absolute CD4 Count 528 510-2,320 /uL CD8% T Suppressor 40 (H) 12 - 38 %    Absolute CD8 Count 640 180-1,520 /uL    CD4:CD8 Ratio 0.8 (L) 0.9 - 4.8   CBC w/ Differential   Result Value Ref Range    WBC 4.3 3.5 - 10.5 10*9/L    RBC 5.28 4.32 - 5.72 10*12/L    HGB 15.8 13.5 - 17.5 g/dL    HCT 16.1 09.6 - 04.5 %    MCV 87.8 81.0 - 95.0 fL    MCH 30.0 26.0 - 34.0 pg    MCHC 34.2 30.0 - 36.0 g/dL    RDW 40.9 81.1 - 91.4 %    MPV 7.6 7.0 - 10.0 fL    Platelet 225 150 - 450 10*9/L    Neutrophils % 47.2 %    Lymphocytes % 37.7 %    Monocytes % 10.2 %    Eosinophils % 3.7 %    Basophils % 1.2 %    Absolute Neutrophils 2.0 1.7 - 7.7 10*9/L    Absolute Lymphocytes 1.6 0.7 - 4.0 10*9/L    Absolute Monocytes 0.4 0.1 - 1.0 10*9/L    Absolute Eosinophils 0.2 0.0 - 0.7 10*9/L    Absolute Basophils 0.1 0.0 - 0.1 10*9/L             Sexual Health & Screening Data  No results found for: LABRPR, RPRTIT, GCNAA, GCSOR, CTNAA, CTSOR      There is no immunization history on file for this patient.

## 2020-12-14 NOTE — Unmapped (Signed)
Doing well with complaints as above  No missed doses or medication related side effects  Continue current regimen of: BIKTARVY  Check viral load, CD4 and other safety labs at next visit  Unlikely that biktarvy is cause of his constellation of symptoms but integrase inhibitors can contribute to neuropsych symptoms. Will keep an eye on things and adjust if other causes cannot be found  Follow up as directed

## 2020-12-14 NOTE — Unmapped (Signed)
Has been seeing primary care provider and neurologist. To date has had head MRI, CT neck and ziopatch all neg. He thinks getting these normal results has made him overall less anxious. Has also weaned off the paxil which he thinks has helped him feel better also

## 2020-12-14 NOTE — Unmapped (Signed)
Faxed signed ROI to pt PCP, [facility] requesting full immunization record be faxed to clinic. ROI scanned into chart

## 2020-12-14 NOTE — Unmapped (Signed)
Your three most recent CD4 T-cell counts are below.   Here are a few things to keep in mind when looking at your numbers:   ??? For most people, we're checking CD4 counts once or twice a year.  ??? It's normal for your CD4 count to be different from visit to visit. Please let us know if you have any questions.      Lab Results   Component Value Date    Absolute CD4 Count 528 08/10/2020    Absolute CD4 Count 540 06/22/2020     Lab Results   Component Value Date    HIV RNA <40 (H) 08/10/2020    HIV RNA 35,300 (H) 06/22/2020        GENERAL INSTRUCTIONS   -- clinic phone number is [phone]  -- return to clinic in 3 months   -- if need sooner appointment, please contact clinic   -- if need urgent assistance, please call 911 or go to ED.

## 2020-12-15 LAB — HIV RNA, QUANTITATIVE, PCR
HIV RNA LOG(10): 1.68 {Log_copies}/mL — ABNORMAL HIGH (ref <0.00)
HIV RNA: 48 {copies}/mL — ABNORMAL HIGH (ref <0)

## 2020-12-20 NOTE — Unmapped (Signed)
[facility] Refill Coordination Note    Specialty Medication(s) to be Shipped:   Infectious Disease: Biktarvy    Other medication(s) to be shipped: No additional medications requested for fill at this time     Austin Thompson, DOB: Sep 18, 1991  Phone: [phone] (home)       All above HIPAA information was verified with patient.     Was a Nurse, learning disability used for this call? No    Completed refill call assessment today to schedule patient's medication shipment from the [facility] [phone]).       Specialty medication(s) and dose(s) confirmed: Regimen is correct and unchanged.   Changes to medications: [name] reports no changes at this time.  Changes to insurance: No  Questions for the pharmacist: No    Confirmed patient received Welcome Packet with first shipment. The patient will receive a drug information handout for each medication shipped and additional FDA Medication Guides as required.       DISEASE/MEDICATION-SPECIFIC INFORMATION        N/A    SPECIALTY MEDICATION ADHERENCE     Medication Adherence    Patient reported X missed doses in the last month: 0  Specialty Medication: biktarvy 50-200-25mg   Patient is on additional specialty medications: No  Patient is on more than two specialty medications: No  Any gaps in refill history greater than 2 weeks in the last 3 months: no  Demonstrates understanding of importance of adherence: yes  Informant: patient  Reliability of informant: reliable  Provider-estimated medication adherence level: good  Patient is at risk for Non-Adherence: No                biktarvy 50-200-25 mg: 7 days of medicine on hand         SHIPPING     Shipping address confirmed in Epic.     Delivery Scheduled: Yes, Expected medication delivery date: 12/29.     Medication will be delivered via Next Day Courier to the prescription address in Epic WAM.    Antonietta Barcelona   [facility] Specialty Technician

## 2020-12-21 MED FILL — BIKTARVY 50 MG-200 MG-25 MG TABLET: 30 days supply | Qty: 30 | Fill #0 | Status: AC

## 2021-01-01 NOTE — Unmapped (Signed)
Duration of Intervention: 5 min     REASON FOR DISCHARGE:  Completed care plan goals    EFFECTIVE DATE:  01/01/2020    OUTSIDE REFERRALS MADE: No, patient connected with primary care and neurology.     Chestina Komatsu LCSW, LCAS  [facility] Social Work   Direct: [phone]

## 2021-01-14 NOTE — Unmapped (Signed)
[facility] Clinical Assessment & Refill Coordination Note    Austin Thompson, DOB: 1991/08/27  Phone: [phone] (home)     All above HIPAA information was verified with patient.     Was a Nurse, learning disability used for this call? No    Specialty Medication(s):   Infectious Disease: Biktarvy     Current Outpatient Medications   Medication Sig Dispense Refill   ??? bictegrav-emtricit-tenofov ala (BIKTARVY) 50-200-25 mg tablet Take 1 tablet by mouth daily. 30 tablet 11   ??? fexofenadine (ALLEGRA) 180 MG tablet Take 180 mg by mouth daily.       No current facility-administered medications for this visit.        Changes to medications: [name] reports no changes at this time.    No Known Allergies    Changes to allergies: No    SPECIALTY MEDICATION ADHERENCE     Biktarvy   : 10-12 days of medicine on hand       Medication Adherence    Patient reported X missed doses in the last month: 0  Specialty Medication: Biktarvy  Patient is on additional specialty medications: No  Any gaps in refill history greater than 2 weeks in the last 3 months: no  Demonstrates understanding of importance of adherence: yes  Informant: patient  Provider-estimated medication adherence level: good  Patient is at risk for Non-Adherence: No          Specialty medication(s) dose(s) confirmed: Regimen is correct and unchanged.     Are there any concerns with adherence? No    Adherence counseling provided? Not needed    CLINICAL MANAGEMENT AND INTERVENTION      Clinical Benefit Assessment:    Do you feel the medicine is effective or helping your condition? Yes    HIV ASSOCIATED LABS:     Lab Results   Component Value Date/Time    HIVRS Detected (A) 08/10/2020 08:53 AM    HIVCP 48 (H) 12/14/2020 08:35 AM    HIVCP <40 (H) 08/10/2020 08:53 AM    HIVCP 35,300 (H) 06/22/2020 12:51 PM    ACD4 640 12/14/2020 08:35 AM    ACD4 528 08/10/2020 08:53 AM    ACD4 540 06/22/2020 12:51 PM       Clinical Benefit counseling provided? Labs from 08/10/20 and 12/14/20 show evidence of clinical benefit    Adverse Effects Assessment:    Are you experiencing any side effects? No    Are you experiencing difficulty administering your medicine? No    Quality of Life Assessment:    How many days over the past month did your HIV  keep you from your normal activities? For example, brushing your teeth or getting up in the morning. 0    Have you discussed this with your provider? Not needed    Therapy Appropriateness:    Is therapy appropriate? Yes, therapy is appropriate and should be continued    DISEASE/MEDICATION-SPECIFIC INFORMATION      N/A    PATIENT SPECIFIC NEEDS     - Does the patient have any physical, cognitive, or cultural barriers? No    - Is the patient high risk? No    - Does the patient require a Care Management Plan? No     - Does the patient require physician intervention or other additional services (i.e. nutrition, smoking cessation, social work)? No      SHIPPING     Specialty Medication(s) to be Shipped:   Infectious Disease: [employer]  Other medication(s) to be shipped: No additional medications requested for fill at this time     Changes to insurance: No    Delivery Scheduled: Yes, Expected medication delivery date: 01/20/21.     Medication will be delivered via Next Day Courier to the confirmed prescription address in [facility].    The patient will receive a drug information handout for each medication shipped and additional FDA Medication Guides as required.  Verified that patient has previously received a Conservation officer, historic buildings.    All of the patient's questions and concerns have been addressed.    [name]   [facility] Specialty Pharmacist

## 2021-01-14 NOTE — Unmapped (Signed)
Spoke with patient he said he is going to call back about appointment on 6/21 with Dr. Loma Boston

## 2021-01-20 MED FILL — BIKTARVY 50 MG-200 MG-25 MG TABLET: ORAL | 30 days supply | Qty: 30 | Fill #1

## 2021-02-10 NOTE — Unmapped (Signed)
[facility] Refill Coordination Note    Specialty Medication(s) to be Shipped:   Infectious Disease: Biktarvy    Other medication(s) to be shipped: No additional medications requested for fill at this time     Austin Thompson, DOB: [birth year]  Phone: [phone] (home)       All above HIPAA information was verified with patient.     Was a Nurse, learning disability used for this call? No    Completed refill call assessment today to schedule patient's medication shipment from the [facility] [phone]).       Specialty medication(s) and dose(s) confirmed: Regimen is correct and unchanged.   Changes to medications: Austin Thompson reports no changes at this time.  Changes to insurance: No  Questions for the pharmacist: No    Confirmed patient received Welcome Packet with first shipment. The patient will receive a drug information handout for each medication shipped and additional FDA Medication Guides as required.       DISEASE/MEDICATION-SPECIFIC INFORMATION        N/A    SPECIALTY MEDICATION ADHERENCE     Medication Adherence    Patient reported X missed doses in the last month: 0  Specialty Medication: Biktarvy  Patient is on additional specialty medications: No  Patient is on more than two specialty medications: No                Biktarvy 50-200-25 mg: 8 days of medicine on hand          SHIPPING     Shipping address confirmed in Epic.     Delivery Scheduled: Yes, Expected medication delivery date: 02/17/21.     Medication will be delivered via Next Day Courier to the prescription address in Epic WAM.    Unk Lightning   [facility] Specialty Technician

## 2021-02-15 DIAGNOSIS — D2262 Melanocytic nevi of left upper limb, including shoulder: Secondary | ICD-10-CM | POA: Diagnosis not present

## 2021-02-15 DIAGNOSIS — D225 Melanocytic nevi of trunk: Secondary | ICD-10-CM | POA: Diagnosis not present

## 2021-02-15 DIAGNOSIS — D485 Neoplasm of uncertain behavior of skin: Secondary | ICD-10-CM | POA: Diagnosis not present

## 2021-02-15 DIAGNOSIS — D2272 Melanocytic nevi of left lower limb, including hip: Secondary | ICD-10-CM | POA: Diagnosis not present

## 2021-02-15 DIAGNOSIS — D2261 Melanocytic nevi of right upper limb, including shoulder: Secondary | ICD-10-CM | POA: Diagnosis not present

## 2021-02-16 MED FILL — BIKTARVY 50 MG-200 MG-25 MG TABLET: ORAL | 30 days supply | Qty: 30 | Fill #2

## 2021-03-09 MED ORDER — FLUVOXAMINE 100 MG TABLET
0 days
Start: 2021-03-09 — End: ?

## 2021-03-09 NOTE — Unmapped (Signed)
Pt left message on shared nurse vm stating his new insurance does not cover Biktarvy, needs alternatives. Per chart, pt last filled Biktarvy with [facility] 2/23. RN placed call to pt, verified pt name and DOB. Pt states he has changed insurance since last fill date, now with [facility]. Pt states he was reading the list of preferred medication and noted Biktarvy designated as not preferred. Pt wants to know what medication she should take instead. RN encouraged pt to call pharmacy help line listed on back of insurance card to find out the list of preferred medications that would be covered. At that point, provider can make decision to switch to a preferred medication or pursue PA for Biktarvy. Pt voiced understanding, will call back to RN when more information received.

## 2021-03-10 NOTE — Unmapped (Signed)
[facility] Refill Coordination Note    Specialty Medication(s) to be Shipped:   Infectious Disease: Biktarvy    Other medication(s) to be shipped: No additional medications requested for fill at this time     Austin Thompson, DOB: Apr 03, 1991  Phone: [phone] (home)       All above HIPAA information was verified with patient.     Was a Nurse, learning disability used for this call? No    Completed refill call assessment today to schedule patient's medication shipment from the [facility] [phone]).       Specialty medication(s) and dose(s) confirmed: Regimen is correct and unchanged.   Changes to medications: Dainel reports no changes at this time.  Changes to insurance: No  Questions for the pharmacist: No    Confirmed patient received Welcome Packet with first shipment. The patient will receive a drug information handout for each medication shipped and additional FDA Medication Guides as required.       DISEASE/MEDICATION-SPECIFIC INFORMATION        N/A    SPECIALTY MEDICATION ADHERENCE     Medication Adherence    Patient reported X missed doses in the last month: 0  Specialty Medication: biktarvy 50-200-25mg   Patient is on additional specialty medications: No  Patient is on more than two specialty medications: No  Any gaps in refill history greater than 2 weeks in the last 3 months: no  Demonstrates understanding of importance of adherence: yes  Informant: patient  Reliability of informant: reliable  Provider-estimated medication adherence level: good                biktarvy 50-200-25 mg: 12 days of medicine on hand         SHIPPING     Shipping address confirmed in Epic.     Delivery Scheduled: Yes, Expected medication delivery date: 03/23.     Medication will be delivered via Next Day Courier to the prescription address in Epic WAM.    Antonietta Barcelona   [facility] Specialty Technician

## 2021-03-10 NOTE — Unmapped (Signed)
RN discussed pt case with [facility] pharmacist G. Lorayne Marek, pharmacist had SSC tech run test claim with pt new insurance; with addition of copay card, pt cost would be $0. RN placed call to pt, verified name and DOB, passed along message from [facility]. Pt states he spoke with them today and already scheduled next shipment. No further needs at this time.

## 2021-03-15 MED FILL — BIKTARVY 50 MG-200 MG-25 MG TABLET: ORAL | 30 days supply | Qty: 30 | Fill #3

## 2021-04-07 NOTE — Unmapped (Signed)
[facility] Refill Coordination Note    Specialty Medication(s) to be Shipped:   Infectious Disease: Biktarvy    Other medication(s) to be shipped: No additional medications requested for fill at this time     Austin Thompson, DOB: 1991-01-23  Phone: [phone] (home)       All above HIPAA information was verified with patient.     Was a Nurse, learning disability used for this call? No    Completed refill call assessment today to schedule patient's medication shipment from the [facility] [phone]).  All relevant notes have been reviewed.     Specialty medication(s) and dose(s) confirmed: Regimen is correct and unchanged.   Changes to medications: Austin Thompson reports no changes at this time.  Changes to insurance: No  New side effects reported not previously addressed with a pharmacist or physician: None reported  Questions for the pharmacist: No    Confirmed patient received a Conservation officer, historic buildings and a Surveyor, mining with first shipment. The patient will receive a drug information handout for each medication shipped and additional FDA Medication Guides as required.       DISEASE/MEDICATION-SPECIFIC INFORMATION        N/A    SPECIALTY MEDICATION ADHERENCE     Medication Adherence    Patient reported X missed doses in the last month: 0  Specialty Medication: BIKTARVY 50-200-25MG    Patient is on additional specialty medications: No  Informant: patient  Confirmed plan for next specialty medication refill: delivery by pharmacy  Refills needed for supportive medications: not needed          Refill Coordination    Has the Patients' Contact Information Changed: No  Is the Shipping Address Different: No         Were doses missed due to medication being on hold? No    BIKTARVY 50-200-25 mg: 14 days of medicine on hand       REFERRAL TO PHARMACIST     Referral to the pharmacist: Not needed      [facility]     Shipping address confirmed in Epic.     Delivery Scheduled: Yes, Expected medication delivery date: 4/22.     Medication will be delivered via UPS to the prescription address in Epic WAM.    Jolene Schimke   [facility] Specialty Technician

## 2021-04-14 MED FILL — BIKTARVY 50 MG-200 MG-25 MG TABLET: ORAL | 30 days supply | Qty: 30 | Fill #4

## 2021-05-02 MED ORDER — PAXLOVID 300 MG (150 MG X 2)-100 MG TABLET (EUA)
0 days
Start: 2021-05-02 — End: ?

## 2021-05-02 MED ORDER — BENZONATATE 100 MG CAPSULE
0.00000 days
Start: 2021-05-02 — End: ?

## 2021-05-12 NOTE — Unmapped (Signed)
[facility] Refill Coordination Note    Specialty Medication(s) to be Shipped:   Infectious Disease: Biktarvy    Other medication(s) to be shipped: No additional medications requested for fill at this time     Austin Thompson, DOB: Feb 15, 1991  Phone: [phone] (home)       All above HIPAA information was verified with patient.     Was a Nurse, learning disability used for this call? No    Completed refill call assessment today to schedule patient's medication shipment from the [facility] [phone]).  All relevant notes have been reviewed.     Specialty medication(s) and dose(s) confirmed: Regimen is correct and unchanged.   Changes to medications: Dexton reports no changes at this time.  Changes to insurance: No  New side effects reported not previously addressed with a pharmacist or physician: None reported  Questions for the pharmacist: No    Confirmed patient received a Conservation officer, historic buildings and a Surveyor, mining with first shipment. The patient will receive a drug information handout for each medication shipped and additional FDA Medication Guides as required.       DISEASE/MEDICATION-SPECIFIC INFORMATION        N/A    SPECIALTY MEDICATION ADHERENCE     Medication Adherence    Patient reported X missed doses in the last month: 0  Specialty Medication: biktarvy 50-200-25mg   Patient is on additional specialty medications: No  Patient is on more than two specialty medications: No              Were doses missed due to medication being on hold? No    biktarvy  : 12 days of medicine on hand       REFERRAL TO PHARMACIST     Referral to the pharmacist: Not needed      [facility]     Shipping address confirmed in Epic.     Delivery Scheduled: Yes, Expected medication delivery date: 5/24.     Medication will be delivered via Next Day Courier to the prescription address in Epic WAM.    [name]   [facility] Specialty Technician

## 2021-05-16 MED FILL — BIKTARVY 50 MG-200 MG-25 MG TABLET: ORAL | 30 days supply | Qty: 30 | Fill #5

## 2021-05-25 ENCOUNTER — Ambulatory Visit
Admit: 2021-05-25 | Discharge: 2021-05-26 | Payer: PRIVATE HEALTH INSURANCE | Attending: Student in an Organized Health Care Education/Training Program | Primary: Student in an Organized Health Care Education/Training Program

## 2021-05-25 LAB — BASIC METABOLIC PANEL
ANION GAP: 6 mmol/L (ref 5–14)
BLOOD UREA NITROGEN: 9 mg/dL (ref 9–23)
BUN / CREAT RATIO: 10
CALCIUM: 9.9 mg/dL (ref 8.7–10.4)
CHLORIDE: 107 mmol/L (ref 98–107)
CO2: 25.2 mmol/L (ref 20.0–31.0)
CREATININE: 0.94 mg/dL
EGFR CKD-EPI (2021) MALE: >90 mL/min/{1.73_m2} (ref >=60)
GLUCOSE RANDOM: 103 mg/dL (ref 70–179)
POTASSIUM: 3.6 mmol/L (ref 3.4–4.8)
SODIUM: 138 mmol/L (ref 135–145)

## 2021-05-25 LAB — AST: AST (SGOT): 18 U/L (ref <=34)

## 2021-05-25 LAB — CBC W/ AUTO DIFF
BASOPHILS ABSOLUTE COUNT: 0 10*9/L (ref 0.0–0.1)
BASOPHILS RELATIVE PERCENT: 0.8 %
EOSINOPHILS ABSOLUTE COUNT: 0.3 10*9/L (ref 0.0–0.5)
EOSINOPHILS RELATIVE PERCENT: 4.8 %
HEMATOCRIT: 40.7 % (ref 39.0–48.0)
HEMOGLOBIN: 14.6 g/dL (ref 12.9–16.5)
LYMPHOCYTES ABSOLUTE COUNT: 2.3 10*9/L (ref 1.1–3.6)
LYMPHOCYTES RELATIVE PERCENT: 40 %
MEAN CORPUSCULAR HEMOGLOBIN CONC: 36 g/dL (ref 32.0–36.0)
MEAN CORPUSCULAR HEMOGLOBIN: 30.9 pg (ref 25.9–32.4)
MEAN CORPUSCULAR VOLUME: 85.9 fL (ref 77.6–95.7)
MEAN PLATELET VOLUME: 8.2 fL (ref 6.8–10.7)
MONOCYTES ABSOLUTE COUNT: 0.5 10*9/L (ref 0.3–0.8)
MONOCYTES RELATIVE PERCENT: 8.2 %
NEUTROPHILS ABSOLUTE COUNT: 2.6 10*9/L (ref 1.8–7.8)
NEUTROPHILS RELATIVE PERCENT: 46.2 %
NUCLEATED RED BLOOD CELLS: 0 /100{WBCs} (ref <=4)
PLATELET COUNT: 228 10*9/L (ref 150–450)
RED BLOOD CELL COUNT: 4.73 10*12/L (ref 4.26–5.60)
RED CELL DISTRIBUTION WIDTH: 13 % (ref 12.2–15.2)
WBC ADJUSTED: 5.7 10*9/L (ref 3.6–11.2)

## 2021-05-25 LAB — ALT: ALT (SGPT): 18 U/L (ref 10–49)

## 2021-05-25 LAB — BILIRUBIN, TOTAL: BILIRUBIN TOTAL: 0.5 mg/dL (ref 0.3–1.2)

## 2021-05-25 LAB — HEMOGLOBIN A1C
ESTIMATED AVERAGE GLUCOSE: 91 mg/dL
HEMOGLOBIN A1C: 4.8 % (ref 4.8–5.6)

## 2021-05-25 NOTE — Unmapped (Signed)
Infectious Diseases Clinic        Assessment/Plan:     HIV  Overall doing well re: HIV. He never misses doses of Biktarvy (BIC/FTC/TAF).    Accesses meds via through insurance.    CD4 counts have not been over 300 for >2Y on suppressive ART.   CD4 rechecks cannot be annual.    Discussed ARV adherence, avoiding antacids, injectable ARVs, switching to alternative ART (pt not interested at this time), taking ARVs with food and U=U (tx as prevention).    Lab Results   Component Value Date    ACD4 640 12/14/2020    CD4 40 12/14/2020    HIVRS Detected (A) 08/10/2020    HIVCP 48 (H) 12/14/2020       ??? Continue current therapy  ??? HIV RNA and safety labs (brief return panel)  ??? Encouraged continued excellent ARV adherence.  ??? Notices mild indigestion after taking pill, taking with food at lunchtime.     Anxiety and depression  Believes related to the stress associated with his HIV diagnosis. .Had previously seen PCP and neuro regarding symptoms. Head MRI, CT neck and ziopatch all negative. Overall feels anxiety is under significantly better control and depression symptoms also improved. Last panic attack was >6 weeks ago. Off of the fluvoxamine since Feb. Feels he is sleeping better. Does endorse a h/o depression in college and wonders if this unmasked some of those symptoms. Has disclosed to wife and family and feels very supported. Has told friends of his immunocompromised condition, but not HIV specifically. Does have some residual chest heaviness and some vision changes + migraines as discussed below.     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 Also has noted persistent vs worsening L eye vision.   - seeing eye doctor next few weeks  - if vision w/u wnl, will discuss referral to Dr. Jenita Seashore in neuro  - can also consider changing off bictarvy, but pt would like to stay on for now. This seems like a relatively unlikely explanation for the constellation of symptoms, but he has many options for ARVs so reasonable to try a switch and see if it helps if the above workup is unrevealing.     COVID-19  - took paxlovid 05/02/21    Nicotine dependence  He reports that he has never smoked. He has never used smokeless tobacco.      Sexual health & secondary prevention  Sex with cis women.  Having sex only with primary partner.   Since last visit, vaginal sex and has not had add'l STI screening.   He does routinely discuss status with partner(s). Never uses condoms.    Not interested in having children. Wife not interested    Lab Results   Component Value Date    RPR Nonreactive 12/14/2020       ??? GC/CT NAATs -- not being checked routinely for this patient  ??? RPR -- not being checked routinely for this patient      Health maintenance    Wt Readings from Last 5 Encounters:   05/25/21 (!) 101.2 kg (223 lb)   10/20/20 89.9 kg (198 lb 4.8 oz)   09/14/20 82.7 kg (182 lb 6.4 oz)   08/10/20 89.4 kg (197 lb 3.2 oz)   06/22/20 82.8 kg (182 lb 9.6 oz)     Oral health  He does  have a dentist. Last dental exam in past 12 months.    Eye health  He does  use corrective lenses. Last eye exam in past 12 months.    Metabolic conditions  Lab Results   Component Value Date    CREATININE 0.86 12/14/2020     # Diabetes - screening needed but deferred to future visit  # Kidney health - no clinical indication for UA  # Bone health - assessment not yet needed    Communicable diseases  Lab Results   Component Value Date    HEPCAB Nonreactive 06/22/2020     # TB screening - needed but deferred to future visit  # HCV screening - negative Ab; rescreen w/Ab q1-2y    Cancer screening  No results found for: PSASCRN, PSA, PAP, FINALDX  # Breast - not applicable  # Cervical - not applicable    # Anorectal - not yet needed - under age 32 (not MSM)  # Colorectal - not yet needed  #  Liver - not applicable  # Lung - not applicable  # Prostate - not applicable    Cardiovascular disease  No results found for: CHOL, HDL, LDL, NONHDL, TRIG  # The ASCVD Risk score Denman George DC Montez Hageman, et al., 2013) failed to calculate.  - is not taking aspirin   - is not taking statin  - BP control excellent  - never smoker    Immunization History   Administered Date(s) Administered   ??? COVID-19 VACC,MRNA,(PFIZER)(PF)(IM) 03/09/2020, 03/30/2020, 10/15/2020   ??? DTaP 07/26/1998   ??? DTaP, Unspecified Formulation 09/19/1993, 10/21/1993, 08/18/1994   ??? Hepatitis B Vaccine, Unspecified Formulation 10/21/1993, 01/23/1994, 05/04/1994   ??? HiB, unspecified 08/30/1993, 10/21/1993, 02/22/1994, 08/18/1994   ??? MMR 08/18/1994, 07/26/1998   ??? Meningococcal Conjugate MCV4P 01/19/2011   ??? OPV 07/26/1998   ??? Pneumococcal Conjugate 13-Valent 05/25/2021   ??? Polio Virus Vaccine, Unspecified Formulation 08/30/1993, 10/21/1993, 01/23/1994   ??? Td (adult) unspecified formulation 04/11/2006   Tetanus vaccine 2017 pre college    Lab Results   Component Value Date    HEPAIGG Nonreactive 06/22/2020    HEPBSAB Reactive (A) 06/22/2020       ??? Screening ordered today: A1c  ??? Immunizations ordered today: PCV-13    Counseling services took more than 50% of today's visit time.    Disposition    Return to clinic 4-6 weeks or sooner if needed.    To do @ next RTC  ??? Discuss eye appt findings  ??? Consider referral for Dr. Rulon Eisenmenger  ??? Consider ART switch  ??? weight                Chief Complaint   Follow-up HIV    HPI  In addition to details in A&P above:    Experienced significant stress associated with HIV diagnosis initially - TMJ etc. Does feel that things are getting better, but has noted some persistent things that seem off and havint' gone away. Questions if streess that is lingering vs something else    Was having L-sided face/TMJ pain - this has resolved.     Does feel like eyesight has stayed bad. Reports dramatic differenece R vs L eye. Some light sensitivity and blurriness. Does feel that it sometimes gets better, but overall worse vision.     Still having some sternal pain. Last panic attack was ~16month ago. Was driving at night (which he notes as a trigger) - was tired and stressed about being driving at  night. Has not had palpitations since the panic attack.    Possibly related to L-sided face pain, has noted dazzling lights effect, almost an aura o f sorts, feels poorly thereafter. reporrts only minimal pain, short lived. Having a few times a week. Seem to be throughout the day, no bovious trigger no associated headache, if present very brief.     Does feel like overall symptoms have improved.     Has gained a lot of weight during this period of stress. Thinks he has gained about 35 pounds. Says he has been exercising more the last few weeks. Not going to gyms yet.    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     Drinks 1-2 drinks/week. Used to smoke marijuana, no longer      No past medical history on file.      Social History  Housing - in house in [place] with wife, also has pets (dog, fish, snake). Mom and dad live in [place], extended family live in [place].   School / Work - Not in school. Employed (collects specimens for laboratory, hiking in woods/streams etc.)    Tobacco - He reports that he has never smoked. He has never used smokeless tobacco.   Alcohol - special occassions    Substance use - none      Review of Systems  As per HPI. All others negative.      Medications and Allergies  He has a current medication list which includes the following prescription(s): bictegrav-emtricit-tenofov ala, fexofenadine, benzonatate, fluvoxamine, and paxlovid co-pack (eua).    Allergies: Patient has no known allergies.      Family History  His family history is not on file.                   BP 108/68 (BP Site: L Arm, BP Position: Sitting, BP Cuff Size: Large)  - Pulse 83  - Temp 36.8 ??C (98.3 ??F) (Oral)  - Ht 188 cm (6' 2)  - Wt (!) 101.2 kg (223 lb)  - BMI 28.63 kg/m??      Const looks well and attentive, alert, appropriate   Eyes sclerae anicteric, noninjected OU   ENT dentition good, mild ttp over frontal and maxillary sinuses b/l   Lymph No cervical, supraclavicular, axillary LAD   CV RRR. No murmurs. No rub or gallop. S1/S2. No LEE   Resp CTAB ant/post, normal work of breathing   GI soft. NTND.   GU deferred   Rectal deferred   Skin no petechiae, ecchymoses or obvious rashes on clothed exam   MSK no joint tenderness some tenderness to palpation over xyphoid process, no neck tenderness   Neuro CN II-XII grossly intact, MAEE, non focal   Psych Appropriate affect. Eye contact good. Linear thoughts. Fluent speech.          [id number]

## 2021-05-25 NOTE — Unmapped (Addendum)
It was great to see you today.    Will plan to follow-up with you after your eye appointment - if there is no obvious explanation or irregularity noted during that exam, I think it would be good to get you back in to see the neurologist. We may try to get you in to see Dr. Jenita Seashore who specializes in migraines, as well.     We can also look at switching your ART medications but would prefer to see what the eye doctor and other work-up demonstrates.     The ID clinic phone number is [phone].  The ID clinic fax number is [phone].    Please note that your laboratory and other results may be visible to you in real time, possibly before they reach your provider. Please allow 48 hours for clinical interpretation of these results. Importantly, even if a result is flagged as abnormal, it may not be one that impacts your health.    For urgent issues on nights and weekends you may reach the ID Physician on call through the [facility] Operator at [phone].     URGENT CARE  Please call ahead to speak with the nursing staff if you are in need of an urgent appointment.       MEDICATIONS  For refills please contact your pharmacy and ask them to electronically send or fax the request to the clinic.   Please bring all medications in original bottles to every appointment.    [employer] (formerly ADAP) or [employer] Eligibility (required even if you do not receive medication through [facility])  Please remember to renew your Juanell Fairly eligibility during renewal periods which occur twice a year: January-March and July-September.     The following are needed for each renewal:   - [facility] Identification (if you don't have one, then a bill with your name and address in [place])   - proof of income (award letter, W-2, or last three check stubs)   If you are unable to come in for renewal, let us know if we can mail, fax or e-mail paperwork to you.   HMAP Contact: [phone].     Terrall Laity, MD  [facility] at [facility]  [address]   [place], [place] [address]  Phone: [phone]   Fax: [phone]     Lab info:  Your most recent CD4 T-cell counts and viral loads are below. Here are a few things to keep in mind when looking at your numbers:  Our goal is to get your virus to be undetectable and keep it undetectable. If the virus is undetectable you are much more likely to stay healthy.  We consider your viral load to be undetectable if it says <40 or if it says Not detected.  For most people, we're checking CD4 counts every other visit (once or twice a year, or sometimes even less).  It's normal for your CD4 count to be different from visit to visit.   You can help by taking your medications at about the same time, every single day. If you're having trouble with taking your medications, it's important to let us know.    Lab Results   Component Value Date    ACD4 640 12/14/2020    CD4 40 12/14/2020    HIVCP 48 (H) 12/14/2020    HIVRS Detected (A) 08/10/2020

## 2021-05-26 LAB — LYMPH MARKER LIMITED,FLOW
ABSOLUTE CD3 CNT: 1679 {cells}/uL (ref 915–3400)
ABSOLUTE CD4 CNT: 897 {cells}/uL (ref 510–2320)
ABSOLUTE CD8 CNT: 759 {cells}/uL (ref 180–1520)
CD3% (T CELLS): 73 % (ref 61–86)
CD4% (T HELPER): 39 % (ref 34–58)
CD4:CD8 RATIO: 1.2 (ref 0.9–4.8)
CD8% T SUPPRESR: 33 % (ref 12–38)

## 2021-05-28 LAB — HIV RNA, QUANTITATIVE, PCR: HIV RNA QNT RSLT: NOT DETECTED

## 2021-06-10 NOTE — Unmapped (Signed)
[facility] Refill Coordination Note    Specialty Medication(s) to be Shipped:   Infectious Disease: Biktarvy    Other medication(s) to be shipped: No additional medications requested for fill at this time     Austin Thompson, DOB: 1991/06/09  Phone: [phone] (home)       All above HIPAA information was verified with patient.     Was a Nurse, learning disability used for this call? No    Completed refill call assessment today to schedule patient's medication shipment from the [facility] [phone]).  All relevant notes have been reviewed.     Specialty medication(s) and dose(s) confirmed: Regimen is correct and unchanged.   Changes to medications: Austin Thompson reports no changes at this time.  Changes to insurance: No  New side effects reported not previously addressed with a pharmacist or physician: None reported  Questions for the pharmacist: No    Confirmed patient received a Conservation officer, historic buildings and a Surveyor, mining with first shipment. The patient will receive a drug information handout for each medication shipped and additional FDA Medication Guides as required.       DISEASE/MEDICATION-SPECIFIC INFORMATION        N/A    SPECIALTY MEDICATION ADHERENCE     Medication Adherence    Patient reported X missed doses in the last month: 0  Specialty Medication: biktarvy              Were doses missed due to medication being on hold? No    biktarvy  : 10 days of medicine on hand       REFERRAL TO PHARMACIST     Referral to the pharmacist: Not needed      [facility]     Shipping address confirmed in Epic.     Delivery Scheduled: Yes, Expected medication delivery date: 6/22.     Medication will be delivered via Next Day Courier to the prescription address in Epic WAM.    [name]   [facility] Specialty Technician

## 2021-06-14 MED FILL — BIKTARVY 50 MG-200 MG-25 MG TABLET: ORAL | 30 days supply | Qty: 30 | Fill #6

## 2021-06-17 NOTE — Unmapped (Signed)
Called patient to inform RW services will expire on 6/30 and a renewal application is in need to be completed. Patient stated he is currently busy and will handle this matter another time and will call back.     Madinah Cathcart-Rowe  Benefits Counselor  Time of Intervention: 5 mins

## 2021-06-28 NOTE — Unmapped (Unsigned)
Infectious Diseases Clinic        The patient reports they are currently: at home. I spent 12 minutes on the real-time audio and video with the patient on the date of service. I spent an additional 20 minutes on pre- and post-visit activities on the date of service.     The patient was physically located in [place] or a state in which I am permitted to provide care. The patient and/or parent/guardian understood that s/he may incur co-pays and cost sharing, and agreed to the telemedicine visit. The visit was reasonable and appropriate under the circumstances given the patient's presentation at the time.    The patient and/or parent/guardian has been advised of the potential risks and limitations of this mode of treatment (including, but not limited to, the absence of in-person examination) and has agreed to be treated using telemedicine. The patient's/patient's family's questions regarding telemedicine have been answered.     If the visit was completed in an ambulatory setting, the patient and/or parent/guardian has also been advised to contact their provider???s office for worsening conditions, and seek emergency medical treatment and/or call 911 if the patient deems either necessary.          Assessment/Plan:     HIV  Overall doing well re: HIV. He never misses doses of Biktarvy (BIC/FTC/TAF).    Accesses meds via through insurance. SW notes RW services expired 06/23/21, pt reports he is available and willing to complete this paperwork prn    CD4 counts have not been over 300 for >2Y on suppressive ART.   CD4 rechecks cannot be annual.    Discussed ARV adherence, avoiding antacids, injectable ARVs, switching to alternative ART (pt not interested at this time), taking ARVs with food and U=U (tx as prevention).    Lab Results   Component Value Date    ACD4 897 05/25/2021    CD4 39 05/25/2021    HIVRS Not Detected 05/25/2021    HIVCP 48 (H) 12/14/2020       ??? Continue current therapy  ??? HIV RNA and safety labs (brief return panel)  ??? Encouraged continued excellent ARV adherence.  ??? Feels that he is having indigestion with all medications, worse since starting his SSRI. Taking famotadine regularly. Will ctm.    Anxiety and depression  Believes related to the stress associated with his HIV diagnosis. Had previously seen PCP and neuro regarding symptoms. Head MRI, CT neck and ziopatch all negative. Does endorse a h/o depression in college and wonders if this unmasked some of those symptoms.Overall feels anxiety is under significantly better control and depression symptoms also improved. Symptoms have further improved since starting SSRI a few weeks ago at the recommendation of his PCP, though he has noted worsened indigestion/GERD since starting. Chest pain/tightness has also improved and he thinks the improves sx control of anxiety has possibly reduced the severity of his migraines, though not resolved (see below). Affect remains somewhat flat and withdrawn during our video visit today.  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 Also has noted persistent vs worsening L eye vision. Saw eye doctor who reports not abnormalities on exam. No real change in symptoms since starting ssri as above.   - referred to neuro (Dr. Jenita Seashore)  - can also consider changing off bictarvy, but pt would like to  stay on for now. This seems like a relatively unlikely explanation for the constellation of symptoms, but he has many options for ARVs so reasonable to try a switch and see if it helps if the above workup is unrevealing.     COVID-19  - took paxlovid 05/02/21    Nicotine dependence  He reports that he has never smoked. He has never used smokeless tobacco.      Sexual health & secondary prevention  Sex with cis women.  Having sex only with primary partner.   Since last visit, vaginal sex and has not had add'l STI screening.   He does routinely discuss status with partner(s). Never uses condoms.    Not interested in having children. Wife not interested    Lab Results   Component Value Date    RPR Nonreactive 12/14/2020       ??? GC/CT NAATs -- not being checked routinely for this patient  ??? RPR -- not being checked routinely for this patient      Health maintenance    Wt Readings from Last 5 Encounters:   05/25/21 (!) 101.2 kg (223 lb)   10/20/20 89.9 kg (198 lb 4.8 oz)   09/14/20 82.7 kg (182 lb 6.4 oz)   08/10/20 89.4 kg (197 lb 3.2 oz)   06/22/20 82.8 kg (182 lb 9.6 oz)     Oral health  He does  have a dentist. Last dental exam in past 12 months.    Eye health  He does  use corrective lenses. Last eye exam in past 12 months.    Metabolic conditions  Lab Results   Component Value Date    CREATININE 0.94 05/25/2021    A1C 4.8 05/25/2021     # Diabetes - screening needed but deferred to future visit  # Kidney health - no clinical indication for UA  # Bone health - assessment not yet needed    Communicable diseases  Lab Results   Component Value Date    HEPCAB Nonreactive 06/22/2020     # TB screening - needed but deferred to future visit  # HCV screening - negative Ab; rescreen w/Ab q1-2y    Cancer screening  No results found for: PSASCRN, PSA, PAP, FINALDX  # Breast - not applicable  # Cervical - not applicable    # Anorectal - not yet needed - under age 72 (not MSM)  # Colorectal - not yet needed  #  Liver - not applicable  # Lung - not applicable  # Prostate - not applicable    Cardiovascular disease  No results found for: CHOL, HDL, LDL, NONHDL, TRIG  # The ASCVD Risk score Denman George DC Montez Hageman, et al., 2013) failed to calculate.  - is not taking aspirin   - is not taking statin  - BP control excellent (not done 06/29/21)  - never smoker    Immunization History   Administered Date(s) Administered   ??? COVID-19 VACC,MRNA,(PFIZER)(PF)(IM) 03/09/2020, 03/30/2020, 10/15/2020   ??? DTaP 07/26/1998   ??? DTaP, Unspecified Formulation 09/19/1993, 10/21/1993, 08/18/1994   ??? Hepatitis B Vaccine, Unspecified Formulation 10/21/1993, 01/23/1994, 05/04/1994   ??? HiB, unspecified 08/30/1993, 10/21/1993, 02/22/1994, 08/18/1994   ??? MMR 08/18/1994, 07/26/1998   ??? Meningococcal Conjugate MCV4P 01/19/2011   ??? OPV 07/26/1998   ??? Pneumococcal Conjugate 13-Valent 05/25/2021   ??? Polio Virus Vaccine, Unspecified Formulation 08/30/1993, 10/21/1993, 01/23/1994   ??? Td (adult) unspecified formulation 04/11/2006   Tetanus vaccine 2017 pre college  Lab Results   Component Value Date    HEPAIGG Nonreactive 06/22/2020    HEPBSAB Reactive (A) 06/22/2020       ??? Screening ordered today: none  ??? Immunizations ordered today: none    Counseling services took more than 50% of today's visit time.    Disposition    Return to clinic 5-6 months or sooner if needed.    To do @ next RTC    ??? F/u referral for Dr. Rulon Eisenmenger  ??? Weight gain discussion                Chief Complaint   Follow-up HIV    HPI  In addition to details in A&P above:    No missed doses of biktegravir. Experienced significant stress associated with HIV diagnosis initially - TMJ etc. Does feel that things are getting better, but has noted some persistent things that seem off and havint' gone away. Questions if streess that is lingering vs something else    Saw optomotrist - visual symptoms evaluated, nothing notable on exam. No explanation for floaters or flashes in eye. Also felt that it could be consist migraines. Also saw his PCP for general health check-up, she suggested SSRI for anxiety (vortioxetine) which he started two weeks ago and has been going fairly well. Reports that he has noticed more GI issues - GERD/indigestion, but notes less anxiety. Headaches are slightly less common. Chest pain has also improved. Estimates that in the last 2 weeks he has had 4-5 occasions, somewhat unchanged. Thinks that they may be less severe. Feels less panicked.     Has been working out more, trying to exercise more - also feels this is helping with his mood. Still more fatigued and tired than he was previously. Feels like he fatigues sooner.     Flashing lights, some numbness, +/- nausea that fades into headahce.       No past medical history on file.      Social History  Housing - in house in [place] with wife, also has pets (dog, fish, snake). Mom and dad live in [place], extended family live in [place].   School / Work - Not in school. Employed (collects specimens for laboratory, hiking in woods/streams etc.)    Tobacco - He reports that he has never smoked. He has never used smokeless tobacco.   Alcohol - special occassions    Substance use - none      Review of Systems  As per HPI. All others negative.      Medications and Allergies  He has a current medication list which includes the following prescription(s): benzonatate, bictegrav-emtricit-tenofov ala, fexofenadine, fluvoxamine, and paxlovid co-pack (eua).    Allergies: Patient has no known allergies.      Family History  His family history is not on file.                   There were no vitals taken for this visit. video visit    Const looks well and attentive, alert, appropriate   Eyes sclerae anicteric, noninjected OU   ENT    Lymph    CV    Resp nl wob, speaking in full sentences   GI    GU deferred   Rectal deferred   Skin no rashes on limited exam (face/neck) w video visit   MSK    Neuro    Psych Flat affect. Eye contact good. Linear thoughts. Fluent speech. Somewhat withdrawn, stable demeanor from prior  [id number] on clothed exam   MSK no joint tenderness some tenderness to palpation over xyphoid process, no neck tenderness   Neuro CN II-XII grossly intact, MAEE, non focal   Psych Appropriate affect. Eye contact good. Linear thoughts. Fluent speech.          [id number]

## 2021-06-29 ENCOUNTER — Telehealth
Admit: 2021-06-29 | Payer: PRIVATE HEALTH INSURANCE | Attending: Student in an Organized Health Care Education/Training Program | Primary: Student in an Organized Health Care Education/Training Program

## 2021-06-29 DIAGNOSIS — H5319 Other subjective visual disturbances: Secondary | ICD-10-CM | POA: Insufficient documentation

## 2021-06-29 NOTE — Unmapped (Signed)
It was great to see you today.    As discussed, will refer you to the neurologist for additional discussion of what sounds like migraine with aura. Please follow-up with your primary care doctor regarding your indigestion symptoms that have worsened since starting the new medication.     The ID clinic phone number is [phone].  The ID clinic fax number is [phone].    Please note that your laboratory and other results may be visible to you in real time, possibly before they reach your provider. Please allow 48 hours for clinical interpretation of these results. Importantly, even if a result is flagged as abnormal, it may not be one that impacts your health.    For urgent issues on nights and weekends you may reach the ID Physician on call through the [facility] Operator at [phone].     URGENT CARE  Please call ahead to speak with the nursing staff if you are in need of an urgent appointment.       MEDICATIONS  For refills please contact your pharmacy and ask them to electronically send or fax the request to the clinic.   Please bring all medications in original bottles to every appointment.    [employer] (formerly [employer]AP) or [employer] Eligibility (required even if you do not receive medication through [facility])  Please remember to renew your Juanell Fairly eligibility during renewal periods which occur twice a year: January-March and July-September.     The following are needed for each renewal:   - [facility] Identification (if you don't have one, then a bill with your name and address in [place])   - proof of income (award letter, W-2, or last three check stubs)   If you are unable to come in for renewal, let us know if we can mail, fax or e-mail paperwork to you.   HMAP Contact: [phone].     Terrall Laity, MD  [facility] Infectious Diseases Clinic at [facility]  [address]   [place], [place] [address]  Phone: [phone]   Fax: [phone]     Lab info:  Your most recent CD4 T-cell counts and viral loads are below. Here are a few things to keep in mind when looking at your numbers:  Our goal is to get your virus to be undetectable and keep it undetectable. If the virus is undetectable you are much more likely to stay healthy.  We consider your viral load to be undetectable if it says <40 or if it says Not detected.  For most people, we're checking CD4 counts every other visit (once or twice a year, or sometimes even less).  It's normal for your CD4 count to be different from visit to visit.   You can help by taking your medications at about the same time, every single day. If you're having trouble with taking your medications, it's important to let us know.    Lab Results   Component Value Date    ACD4 897 05/25/2021    CD4 39 05/25/2021    HIVCP 48 (H) 12/14/2020    HIVRS Not Detected 05/25/2021

## 2021-07-11 NOTE — Unmapped (Signed)
[facility] Clinical Assessment & Refill Coordination Note    Austin Thompson, DOB: Sep 18, 1991  Phone: [phone] (home)     All above HIPAA information was verified with patient.     Was a Nurse, learning disability used for this call? No    Specialty Medication(s):   Infectious Disease: Biktarvy     Current Outpatient Medications   Medication Sig Dispense Refill   ??? benzonatate (TESSALON) 100 MG capsule TAKE 1 CAPSULE BY MOUTH THREE TIMES A DAY AS NEEDED FOR COUGH FOR UP TO 7 DAYS     ??? bictegrav-emtricit-tenofov ala (BIKTARVY) 50-200-25 mg tablet Take 1 tablet by mouth daily. 30 tablet 11   ??? fexofenadine (ALLEGRA) 180 MG tablet Take 180 mg by mouth daily.     ??? fluvoxaMINE (LUVOX) 100 MG tablet      ??? PAXLOVID CO-PACK, EUA, 150 mg x 2- 100 mg tablet FOLLOW PACKAGE DIRECTIONS     ??? vortioxetine (TRINTELLIX) 20 mg tablet Take 20 mg by mouth. Once daily       No current facility-administered medications for this visit.        Changes to medications: Romney reports starting the following medications: Trintellix 20mg     No Known Allergies    Changes to allergies: No    SPECIALTY MEDICATION ADHERENCE     Biktarvy 50-200-25 mg: 12-13 days of medicine on hand       Medication Adherence    Patient reported X missed doses in the last month: 0  Specialty Medication: Biktarvy 50-200-25 mg  Patient is on additional specialty medications: No  Any gaps in refill history greater than 2 weeks in the last 3 months: no  Demonstrates understanding of importance of adherence: yes  Informant: patient  Provider-estimated medication adherence level: good  Patient is at risk for Non-Adherence: No          Specialty medication(s) dose(s) confirmed: Regimen is correct and unchanged.     Are there any concerns with adherence? No    Adherence counseling provided? Not needed    CLINICAL MANAGEMENT AND INTERVENTION      Clinical Benefit Assessment:    Do you feel the medicine is effective or helping your condition? Yes    HIV ASSOCIATED LABS:     Lab Results   Component Value Date/Time    HIVRS Not Detected 05/25/2021 04:16 PM    HIVRS Detected (A) 08/10/2020 08:53 AM    HIVCP 48 (H) 12/14/2020 08:35 AM    HIVCP <40 (H) 08/10/2020 08:53 AM    HIVCP 35,300 (H) 06/22/2020 12:51 PM    ACD4 897 05/25/2021 04:16 PM    ACD4 640 12/14/2020 08:35 AM    ACD4 528 08/10/2020 08:53 AM       Clinical Benefit counseling provided? Labs from 05/25/21 show evidence of clinical benefit    Adverse Effects Assessment:    Are you experiencing any side effects? No    Are you experiencing difficulty administering your medicine? No    How many days over the past month did your HIV  keep you from your normal activities? For example, brushing your teeth or getting up in the morning. 0    Have you discussed this with your provider? Not needed    Acute Infection Status:    Acute infections noted within Epic:  No active infections  Patient reported infection: None    Therapy Appropriateness:    Is therapy appropriate? Yes, therapy is appropriate and should be continued  DISEASE/MEDICATION-SPECIFIC INFORMATION      N/A    PATIENT SPECIFIC NEEDS     - Does the patient have any physical, cognitive, or cultural barriers? No    - Is the patient high risk? No    - Does the patient require a Care Management Plan? No     - Does the patient require physician intervention or other additional services (i.e. nutrition, smoking cessation, social work)? No      SHIPPING     Specialty Medication(s) to be Shipped:   Infectious Disease: Biktarvy    Other medication(s) to be shipped: No additional medications requested for fill at this time     Changes to insurance: No    Delivery Scheduled: Yes, Expected medication delivery date: 07/19/21.     Medication will be delivered via Next Day Courier to the confirmed prescription address in [facility].    The patient will receive a drug information handout for each medication shipped and additional FDA Medication Guides as required. Verified that patient has previously received a Conservation officer, historic buildings and a Surveyor, mining.    The patient or caregiver noted above participated in the development of this care plan and knows that they can request review of or adjustments to the care plan at any time.      All of the patient's questions and concerns have been addressed.    [name]   [facility] Specialty Pharmacist

## 2021-07-14 IMAGING — CT CT HEAD W/O CM
3 series · 15 of 47 positions shown, 18 images · non-contrast
Comparison: None.

CLINICAL DATA: Left-sided facial pain, left-sided body numbness

EXAM:
CT HEAD WITHOUT CONTRAST
TECHNIQUE: Contiguous axial images were obtained from the base of the skull
through the vertex without intravenous contrast.

[Series 3: coronal soft tissue · coronal · 0.30mm/px · 3 of 65 slices shown]
[im 22/65  brain]
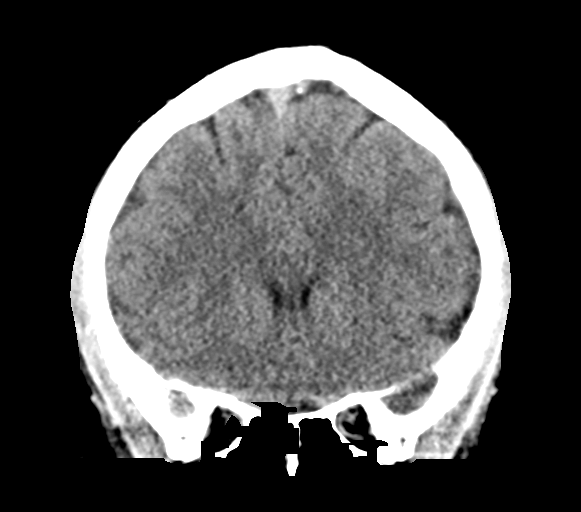
[im 29/65  brain]
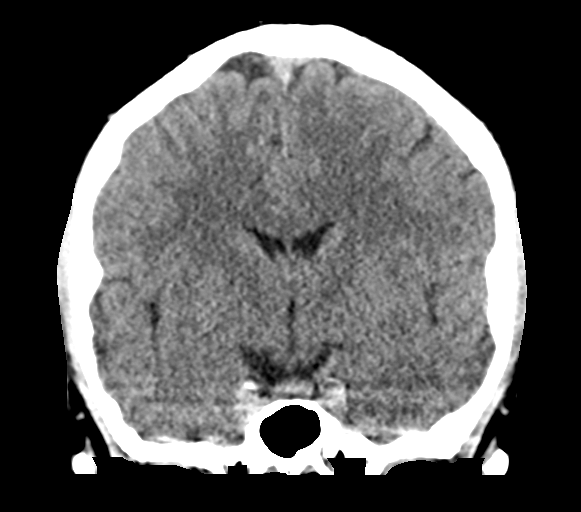
[im 36/65  brain]
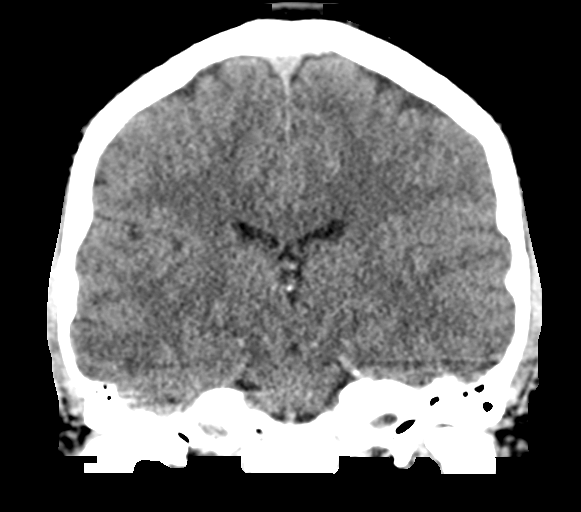

[Series 4: sagittal soft tissue · sagittal · 0.31mm/px · 3 of 54 slices shown]
[im 18/54  brain]
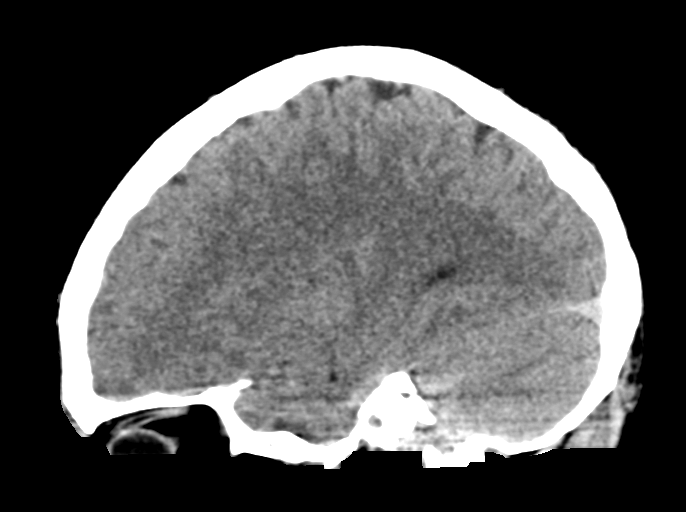
[im 27/54  brain]
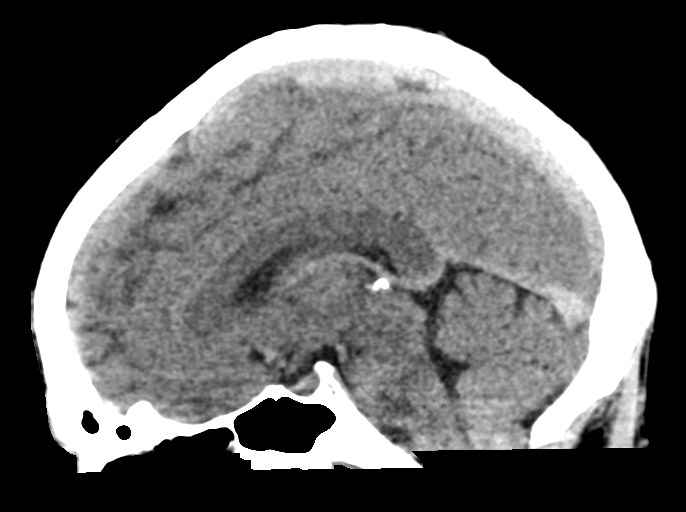
[im 36/54  brain]
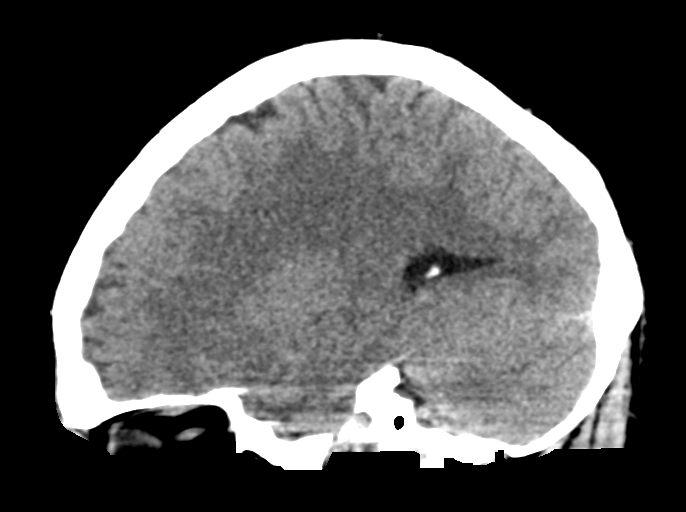

[Series 5: head wo · axial · 0.43mm/px · z∈[+357,+482]mm · 9 of 30 slices shown, 12 images]
[im 3/30  brain]
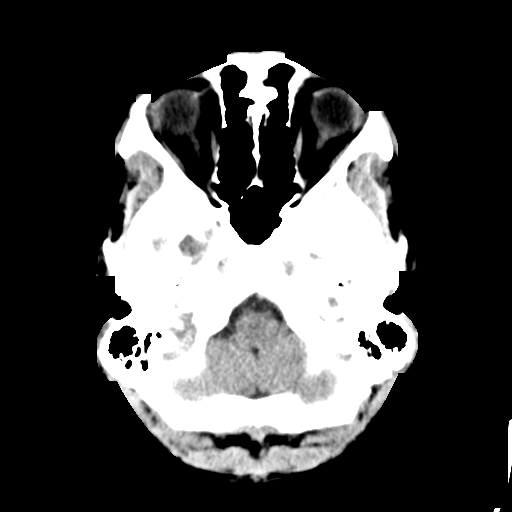
[im 3/30  bone]
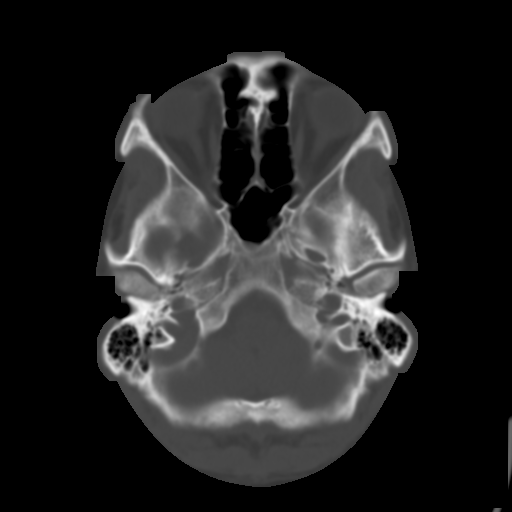
[im 6/30  brain]
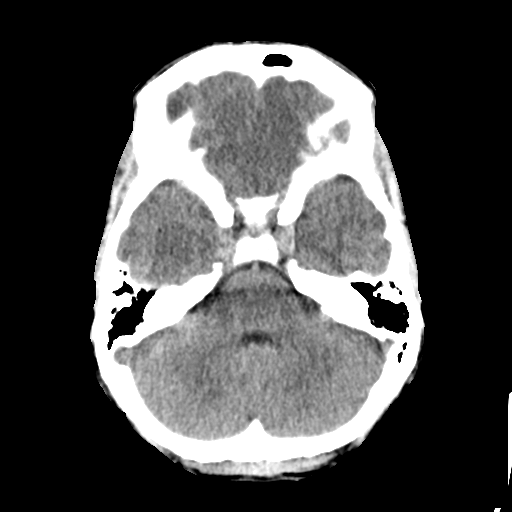
[im 9/30  brain]
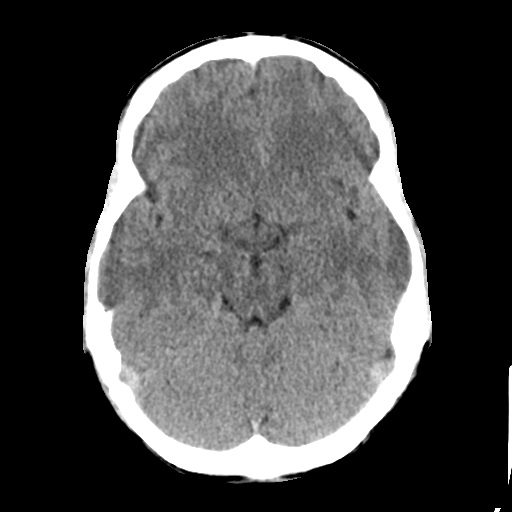
[im 12/30  brain]
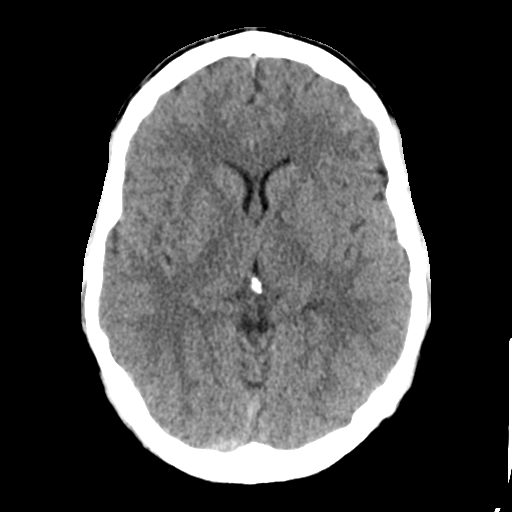
[im 16/30  brain]
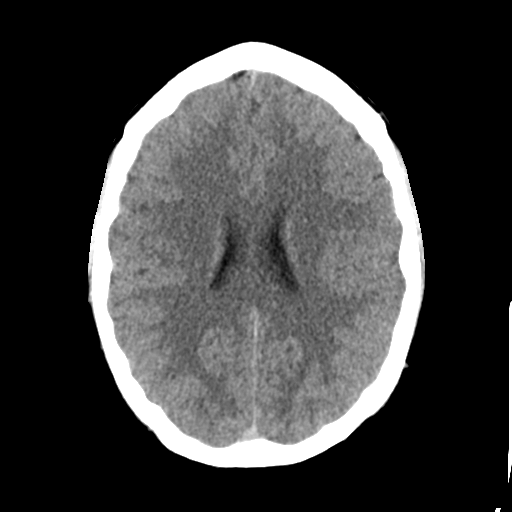
[im 16/30  bone]
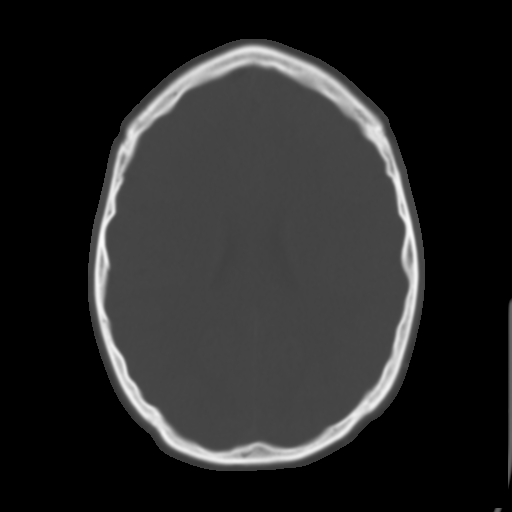
[im 19/30  brain]
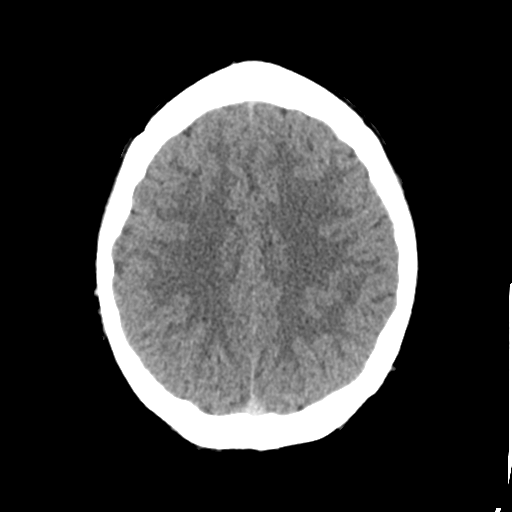
[im 22/30  brain]
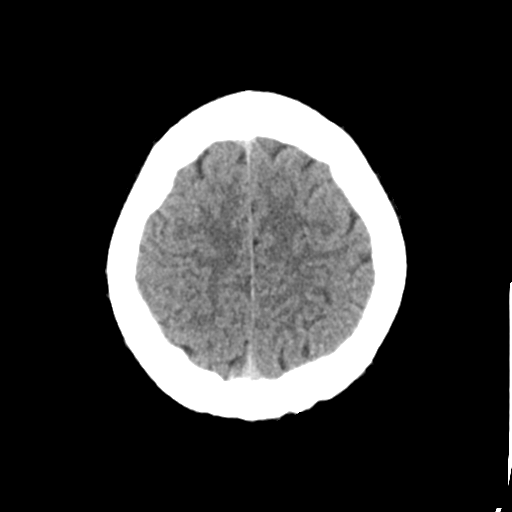
[im 25/30  brain]
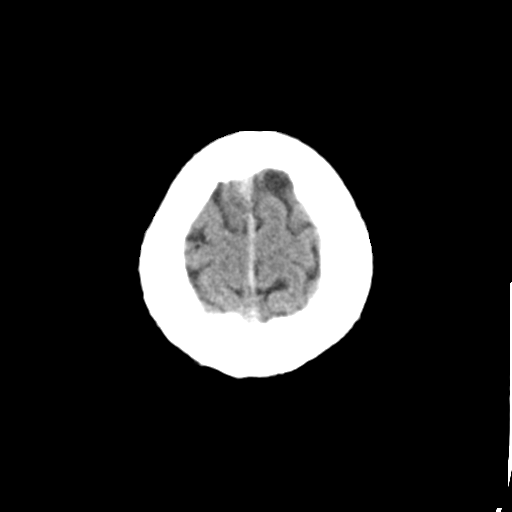
[im 28/30  brain]
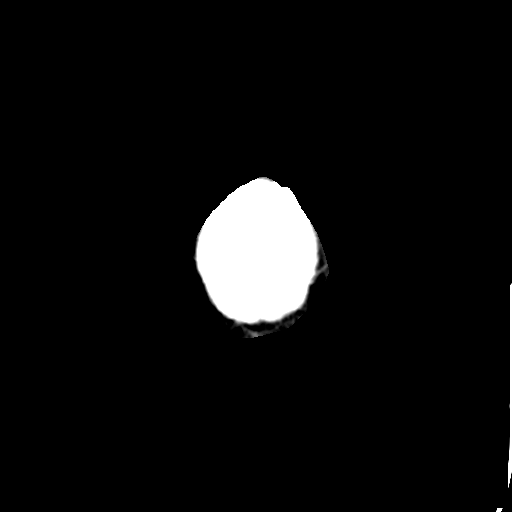
[im 28/30  bone]
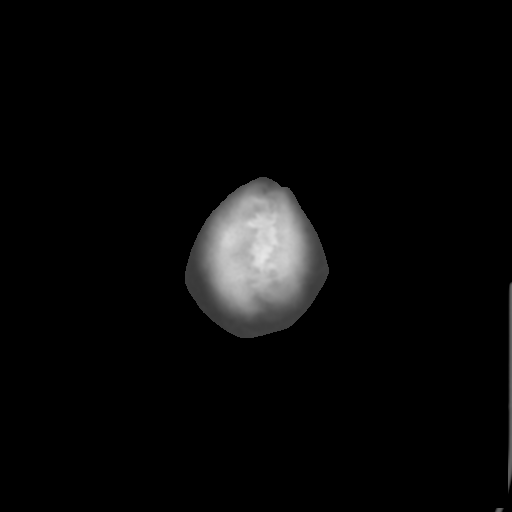

[15 of 47 positions shown; findings below may reference images not displayed]

FINDINGS: Brain: No acute infarct or hemorrhage. Lateral ventricles and
midline structures are unremarkable. No acute extra-axial fluid
collections. No mass effect.

Vascular: No hyperdense vessel or unexpected calcification.

Skull: Normal. Negative for fracture or focal lesion.

Sinuses/Orbits: No acute finding.

Other: None.
IMPRESSION: 1. No acute intracranial process.

## 2021-07-18 DIAGNOSIS — G43109 Migraine with aura, not intractable, without status migrainosus: Principal | ICD-10-CM

## 2021-07-18 DIAGNOSIS — B20 Human immunodeficiency virus [HIV] disease: Principal | ICD-10-CM

## 2021-07-18 MED FILL — BIKTARVY 50 MG-200 MG-25 MG TABLET: ORAL | 30 days supply | Qty: 30 | Fill #7

## 2021-08-10 NOTE — Unmapped (Signed)
[facility] Refill Coordination Note    Specialty Medication(s) to be Shipped:   Infectious Disease: Biktarvy    Other medication(s) to be shipped: No additional medications requested for fill at this time     Austin Thompson, DOB: 1991/10/21  Phone: [phone] (home)       All above HIPAA information was verified with patient.     Was a Nurse, learning disability used for this call? No    Completed refill call assessment today to schedule patient's medication shipment from the [facility] [phone]).  All relevant notes have been reviewed.     Specialty medication(s) and dose(s) confirmed: Regimen is correct and unchanged.   Changes to medications: Austin Thompson reports no changes at this time.  Changes to insurance: No  New side effects reported not previously addressed with a pharmacist or physician: None reported  Questions for the pharmacist: No    Confirmed patient received a Conservation officer, historic buildings and a Surveyor, mining with first shipment. The patient will receive a drug information handout for each medication shipped and additional FDA Medication Guides as required.       DISEASE/MEDICATION-SPECIFIC INFORMATION        N/A    SPECIALTY MEDICATION ADHERENCE     Medication Adherence    Patient reported X missed doses in the last month: 0  Specialty Medication: biktarvy              Were doses missed due to medication being on hold? No    biktarvy  : 10 days of medicine on hand       REFERRAL TO PHARMACIST     Referral to the pharmacist: Not needed      [facility]     Shipping address confirmed in Epic.     Delivery Scheduled: Yes, Expected medication delivery date: 8/23.     Medication will be delivered via Next Day Courier to the prescription address in Epic WAM.    Austin Thompson   [facility] Specialty Technician

## 2021-08-15 MED FILL — BIKTARVY 50 MG-200 MG-25 MG TABLET: ORAL | 30 days supply | Qty: 30 | Fill #8

## 2021-09-08 NOTE — Unmapped (Signed)
[facility] Refill Coordination Note    Specialty Medication(s) to be Shipped:   Infectious Disease: Biktarvy    Other medication(s) to be shipped: No additional medications requested for fill at this time     Austin Thompson, DOB: 1991/11/02  Phone: [phone] (home)       All above HIPAA information was verified with patient.     Was a Nurse, learning disability used for this call? No    Completed refill call assessment today to schedule patient's medication shipment from the [facility] [phone]).  All relevant notes have been reviewed.     Specialty medication(s) and dose(s) confirmed: Regimen is correct and unchanged.   Changes to medications: Evan reports no changes at this time.  Changes to insurance: No  New side effects reported not previously addressed with a pharmacist or physician: None reported  Questions for the pharmacist: No    Confirmed patient received a Conservation officer, historic buildings and a Surveyor, mining with first shipment. The patient will receive a drug information handout for each medication shipped and additional FDA Medication Guides as required.       DISEASE/MEDICATION-SPECIFIC INFORMATION        N/A    SPECIALTY MEDICATION ADHERENCE     Medication Adherence    Patient reported X missed doses in the last month: 0  Specialty Medication: Biktarvy  Patient is on additional specialty medications: No              Were doses missed due to medication being on hold? No    Biktarvy 50-200-25 mg: 10-12 days of medicine on hand        REFERRAL TO PHARMACIST     Referral to the pharmacist: Not needed      [facility]     Shipping address confirmed in Epic.     Delivery Scheduled: Yes, Expected medication delivery date: 09/15/21.     Medication will be delivered via Next Day Courier to the prescription address in Epic WAM.    Unk Lightning   [facility] Specialty Technician

## 2021-09-14 MED FILL — BIKTARVY 50 MG-200 MG-25 MG TABLET: ORAL | 30 days supply | Qty: 30 | Fill #9

## 2021-10-07 NOTE — Unmapped (Signed)
[facility] Clinical Assessment & Refill Coordination Note    Austin Thompson, DOB: [birth year]  Phone: [phone] (home)     All above HIPAA information was verified with patient.     Was a Nurse, learning disability used for this call? No    Specialty Medication(s):   Infectious Disease: Biktarvy     Current Outpatient Medications   Medication Sig Dispense Refill   ??? benzonatate (TESSALON) 100 MG capsule TAKE 1 CAPSULE BY MOUTH THREE TIMES A DAY AS NEEDED FOR COUGH FOR UP TO 7 DAYS     ??? bictegrav-emtricit-tenofov ala (BIKTARVY) 50-200-25 mg tablet Take 1 tablet by mouth daily. 30 tablet 11   ??? fexofenadine (ALLEGRA) 180 MG tablet Take 180 mg by mouth daily.     ??? fluvoxaMINE (LUVOX) 100 MG tablet      ??? PAXLOVID CO-PACK, EUA, 150 mg x 2- 100 mg tablet FOLLOW PACKAGE DIRECTIONS     ??? vortioxetine (TRINTELLIX) 20 mg tablet Take 20 mg by mouth. Once daily       No current facility-administered medications for this visit.        Changes to medications: Austin Thompson reports no changes at this time.    No Known Allergies    Changes to allergies: No    SPECIALTY MEDICATION ADHERENCE     Biktarvy 50-200-25 mg: 10 days of medicine on hand       Medication Adherence    Patient reported X missed doses in the last month: 0  Specialty Medication: Biktarvy 50-200-25mg   Patient is on additional specialty medications: No  Any gaps in refill history greater than 2 weeks in the last 3 months: no  Demonstrates understanding of importance of adherence: yes  Informant: patient  Provider-estimated medication adherence level: good          Specialty medication(s) dose(s) confirmed: Regimen is correct and unchanged.     Are there any concerns with adherence? No    Adherence counseling provided? Not needed    CLINICAL MANAGEMENT AND INTERVENTION      Clinical Benefit Assessment:    Do you feel the medicine is effective or helping your condition? Yes    HIV ASSOCIATED LABS:     Lab Results   Component Value Date/Time    HIVRS Not Detected 05/25/2021 04:16 PM    HIVRS Detected (A) 08/10/2020 08:53 AM    HIVCP 48 (H) 12/14/2020 08:35 AM    HIVCP <40 (H) 08/10/2020 08:53 AM    HIVCP 35,300 (H) 06/22/2020 12:51 PM    ACD4 897 05/25/2021 04:16 PM    ACD4 640 12/14/2020 08:35 AM    ACD4 528 08/10/2020 08:53 AM       Clinical Benefit counseling provided? Labs from 05/25/21 show evidence of clinical benefit    Adverse Effects Assessment:    Are you experiencing any side effects? No    Are you experiencing difficulty administering your medicine? No    Quality of Life Assessment:    How many days over the past month did your HIV  keep you from your normal activities? For example, brushing your teeth or getting up in the morning. 0    Have you discussed this with your provider? Not needed    Acute Infection Status:    Acute infections noted within Epic:  No active infections  Patient reported infection: None    Therapy Appropriateness:    Is therapy appropriate and patient progressing towards therapeutic goals? Yes, therapy is appropriate and should  be continued    DISEASE/MEDICATION-SPECIFIC INFORMATION      N/A    PATIENT SPECIFIC NEEDS     - Does the patient have any physical, cognitive, or cultural barriers? No    - Is the patient high risk? No    - Does the patient require a Care Management Plan? No     - Does the patient require physician intervention or other additional services (i.e. nutrition, smoking cessation, social work)? No      SHIPPING     Specialty Medication(s) to be Shipped:   Infectious Disease: Biktarvy    Other medication(s) to be shipped: No additional medications requested for fill at this time     Changes to insurance: No    Delivery Scheduled: Yes, Expected medication delivery date: 10/12/21.     Medication will be delivered via Next Day Courier to the confirmed prescription address in [facility].    The patient will receive a drug information handout for each medication shipped and additional FDA Medication Guides as required. Verified that patient has previously received a Conservation officer, historic buildings and a Surveyor, mining.    The patient or caregiver noted above participated in the development of this care plan and knows that they can request review of or adjustments to the care plan at any time.      All of the patient's questions and concerns have been addressed.    [name]   [facility] Specialty Pharmacist

## 2021-10-11 MED FILL — BIKTARVY 50 MG-200 MG-25 MG TABLET: ORAL | 30 days supply | Qty: 30 | Fill #10

## 2021-11-04 NOTE — Unmapped (Signed)
[facility] Refill Coordination Note    Specialty Medication(s) to be Shipped:   Infectious Disease: Biktarvy    Other medication(s) to be shipped: No additional medications requested for fill at this time     Austin Thompson, DOB: 21-Jul-1991  Phone: [phone] (home)       All above HIPAA information was verified with patient.     Was a Nurse, learning disability used for this call? No    Completed refill call assessment today to schedule patient's medication shipment from the [facility] [phone]).  All relevant notes have been reviewed.     Specialty medication(s) and dose(s) confirmed: Regimen is correct and unchanged.   Changes to medications: Austin Thompson reports no changes at this time.  Changes to insurance: No  New side effects reported not previously addressed with a pharmacist or physician: None reported  Questions for the pharmacist: No    Confirmed patient received a Conservation officer, historic buildings and a Surveyor, mining with first shipment. The patient will receive a drug information handout for each medication shipped and additional FDA Medication Guides as required.       DISEASE/MEDICATION-SPECIFIC INFORMATION        N/A    SPECIALTY MEDICATION ADHERENCE     Medication Adherence    Patient reported X missed doses in the last month: 1  Specialty Medication: BIKTARVY  Patient is on additional specialty medications: No              Were doses missed due to medication being on hold? No    Biktarvy 50-200-25 mg: 10 days of medicine on hand   *     REFERRAL TO PHARMACIST     Referral to the pharmacist: Not needed      [facility]     Shipping address confirmed in Epic.     Delivery Scheduled: Yes, Expected medication delivery date: 11/09/21.     Medication will be delivered via Next Day Courier to the prescription address in Epic WAM.    Austin Thompson   [facility] Specialty Technician

## 2021-11-08 MED FILL — BIKTARVY 50 MG-200 MG-25 MG TABLET: ORAL | 30 days supply | Qty: 30 | Fill #11

## 2021-12-02 DIAGNOSIS — B20 Human immunodeficiency virus [HIV] disease: Principal | ICD-10-CM

## 2021-12-02 MED ORDER — BIKTARVY 50 MG-200 MG-25 MG TABLET
ORAL_TABLET | Freq: Every day | ORAL | 1 refills | 30 days | Status: CP
Start: 2021-12-02 — End: ?
  Filled 2021-12-12: qty 30, 30d supply, fill #0

## 2021-12-02 NOTE — Unmapped (Signed)
Refill request received for patient.  pharmacy     Medication Requested: Austin Thompson  Last Office Visit: 06/29/21  Next Office Visit: 01/04/22      Per Provider note:  HIV  Overall doing well re: HIV. He never misses doses of Biktarvy (BIC/FTC/TAF).    Will refill medication as per standing order protocol

## 2021-12-06 NOTE — Unmapped (Signed)
[facility] Refill Coordination Note    Specialty Medication(s) to be Shipped:   Infectious Disease: Biktarvy    Other medication(s) to be shipped: No additional medications requested for fill at this time     Austin Thompson, DOB: 31-Jan-1991  Phone: [phone] (home)       All above HIPAA information was verified with patient.     Was a Nurse, learning disability used for this call? No    Completed refill call assessment today to schedule patient's medication shipment from the [facility] [phone]).  All relevant notes have been reviewed.     Specialty medication(s) and dose(s) confirmed: Regimen is correct and unchanged.   Changes to medications: Austin Thompson reports no changes at this time.  Changes to insurance: No  New side effects reported not previously addressed with a pharmacist or physician: None reported  Questions for the pharmacist: No    Confirmed patient received a Conservation officer, historic buildings and a Surveyor, mining with first shipment. The patient will receive a drug information handout for each medication shipped and additional FDA Medication Guides as required.       DISEASE/MEDICATION-SPECIFIC INFORMATION        N/A    SPECIALTY MEDICATION ADHERENCE     Medication Adherence    Patient reported X missed doses in the last month: 0  Specialty Medication: BIKTARVY 50-200-25 mg tablet (bictegrav-emtricit-tenofov ala)  Patient is on additional specialty medications: No              Were doses missed due to medication being on hold? No    Biktarvy 50-200-25 mg: 10 days of medicine on hand        REFERRAL TO PHARMACIST     Referral to the pharmacist: Not needed      [facility]     Shipping address confirmed in Epic.     Delivery Scheduled: Yes, Expected medication delivery date: 12/13/21.     Medication will be delivered via UPS to the prescription address in Epic WAM.    Unk Lightning   [facility] Specialty Technician

## 2021-12-28 ENCOUNTER — Other Ambulatory Visit: Payer: Self-pay

## 2021-12-28 ENCOUNTER — Encounter: Payer: Self-pay | Admitting: Urology

## 2021-12-28 ENCOUNTER — Ambulatory Visit (INDEPENDENT_AMBULATORY_CARE_PROVIDER_SITE_OTHER): Payer: BC Managed Care – PPO | Admitting: Urology

## 2021-12-28 VITALS — BP 125/71 | HR 93 | Ht 74.0 in | Wt 228.0 lb

## 2021-12-28 DIAGNOSIS — Z3009 Encounter for other general counseling and advice on contraception: Secondary | ICD-10-CM | POA: Diagnosis not present

## 2021-12-28 NOTE — Progress Notes (Signed)
12/28/2021 1:57 PM   Craig Hicks September 21, 1991 [id number]  Referring provider: No referring provider defined for this encounter.  Chief Complaint  Patient presents with   VAS Consult    HPI: Craig Hicks is a 31 y.o. male who presents for vasectomy counseling.  He and his wife have been married for 12 years and have decided they do not want children Denies prior history urologic problems including chronic scrotal pain, epididymitis or orchitis No previous history inguinal hernia or pelvic surgery No history of bleeding or clotting disorders   PMH: Past Medical History:  Diagnosis Date   Depression     Surgical History: History reviewed. No pertinent surgical history.  Home Medications:  Allergies as of 12/28/2021   No Known Allergies      Medication List        Accurate as of December 28, 2021  1:57 PM. If you have any questions, ask your nurse or doctor.          omeprazole 20 MG capsule Commonly known as: PRILOSEC Take 20 mg by mouth daily.        Allergies: No Known Allergies  Family History: History reviewed. No pertinent family history.  Social History:  reports that he has never smoked. He has never used smokeless tobacco. He reports current alcohol use. He reports that he does not use drugs.   Physical Exam: BP 125/71    Pulse 93    Ht 6\' 2"  (1.88 m)    Wt 228 lb (103.4 kg)    BMI 29.27 kg/m   Constitutional:  Alert and oriented, No acute distress. HEENT: [place] AT, moist mucus membranes.  Trachea midline, no masses. Cardiovascular: No clubbing, cyanosis, or edema. Respiratory: Normal respiratory effort, no increased work of breathing. GI: Abdomen is soft, nontender, nondistended, no abdominal masses GU: Phallus uncircumcised without lesions, testes descended bilaterally without masses or tenderness, spermatic cord/epididymis palpably normal bilaterally.  Vasa easily palpable bilaterally Skin: No rashes, bruises or suspicious lesions. Neurologic:  Grossly intact, no focal deficits, moving all 4 extremities. Psychiatric: Normal mood and affect.   Assessment & Plan:    1.  Undesired fertility Desires to schedule vasectomy We had a long discussion about vasectomy. We specifically discussed the procedure, recovery and the risks, benefits and alternatives of vasectomy. I explained that the procedure entails removal of a segment of each vas deferens, each of which conducts sperm, and that the purpose of this procedure is to cause sterility (inability to produce children or cause pregnancy). Vasectomy is intended to be permanent and irreversible form of contraception. Options for fertility after vasectomy include vasectomy reversal, or sperm retrieval with in vitro fertilization. These options are not always successful, and they may be expensive. We discussed reversible forms of birth control such as condoms, IUD or diaphragms, as well as the option of freezing sperm in a sperm bank prior to the vasectomy procedure. We discussed the importance of avoiding strenuous exercise for four days after vasectomy, and the importance of refraining from any form of ejaculation for seven days after vasectomy. I explained that vasectomy does not produce immediate sterility so another form of contraceptive must be used until sterility is assured by having semen checked for sperm. Thus, a post vasectomy semen analysis is necessary to confirm sterility. Rarely, vasectomy must be repeated. We discussed the approximately 1 in 2,000 risk of pregnancy after vasectomy for men who have post-vasectomy semen analysis showing absent sperm or rare non-motile sperm. Typical side effects include  a small amount of oozing blood, some discomfort and mild swelling in the area of incision.  Vasectomy does not affect sexual performance, function, please, sensation, interest, desire, satisfaction, penile erection, volume of semen or ejaculation. Other rare risks include allergy or adverse  reaction to an anesthetic, testicular atrophy, hematoma, infection/abscess, prolonged tenderness of the vas deferens, pain, swelling, painful nodule or scar (called sperm granuloma) or epididymtis. We discussed chronic testicular pain syndrome. This has been reported to occur in as many as 1-2% of men and may be permanent. This can be treated with medication, small procedures or (rarely) surgery. He indicated he would call back if he desires a preprocedure anxiolytic and would need a driver if utilizing   Riki Altes, MD  [facility] [address] [place], [place] [address] [phone]

## 2021-12-28 NOTE — Patient Instructions (Signed)

## 2022-01-04 ENCOUNTER — Ambulatory Visit
Admit: 2022-01-04 | Discharge: 2022-01-04 | Attending: Student in an Organized Health Care Education/Training Program | Primary: Student in an Organized Health Care Education/Training Program

## 2022-01-04 DIAGNOSIS — B20 Human immunodeficiency virus [HIV] disease: Secondary | ICD-10-CM | POA: Diagnosis not present

## 2022-01-04 LAB — HIV ANTIBODY (ROUTINE TESTING W REFLEX): HIV 1&2 Ab, 4th Generation: POSITIVE

## 2022-01-04 LAB — CBC W/ AUTO DIFF
BASOPHILS ABSOLUTE COUNT: 0.1 10*9/L (ref 0.0–0.1)
BASOPHILS RELATIVE PERCENT: 0.8 %
EOSINOPHILS ABSOLUTE COUNT: 0.2 10*9/L (ref 0.0–0.5)
EOSINOPHILS RELATIVE PERCENT: 3.5 %
HEMATOCRIT: 44.8 % (ref 39.0–48.0)
HEMOGLOBIN: 15.5 g/dL (ref 12.9–16.5)
LYMPHOCYTES ABSOLUTE COUNT: 2.4 10*9/L (ref 1.1–3.6)
LYMPHOCYTES RELATIVE PERCENT: 39.7 %
MEAN CORPUSCULAR HEMOGLOBIN CONC: 34.6 g/dL (ref 32.0–36.0)
MEAN CORPUSCULAR HEMOGLOBIN: 30.5 pg (ref 25.9–32.4)
MEAN CORPUSCULAR VOLUME: 88.3 fL (ref 77.6–95.7)
MEAN PLATELET VOLUME: 8 fL (ref 6.8–10.7)
MONOCYTES ABSOLUTE COUNT: 0.5 10*9/L (ref 0.3–0.8)
MONOCYTES RELATIVE PERCENT: 8.6 %
NEUTROPHILS ABSOLUTE COUNT: 2.8 10*9/L (ref 1.8–7.8)
NEUTROPHILS RELATIVE PERCENT: 47.4 %
PLATELET COUNT: 247 10*9/L (ref 150–450)
RED BLOOD CELL COUNT: 5.08 10*12/L (ref 4.26–5.60)
RED CELL DISTRIBUTION WIDTH: 13.1 % (ref 12.2–15.2)
WBC ADJUSTED: 6 10*9/L (ref 3.6–11.2)

## 2022-01-04 LAB — BASIC METABOLIC PANEL
ANION GAP: 11 mmol/L (ref 5–14)
BLOOD UREA NITROGEN: 12 mg/dL (ref 9–23)
BUN / CREAT RATIO: 12
CALCIUM: 9.7 mg/dL (ref 8.7–10.4)
CHLORIDE: 103 mmol/L (ref 98–107)
CO2: 26.5 mmol/L (ref 20.0–31.0)
CREATININE: 0.99 mg/dL
EGFR CKD-EPI (2021) MALE: >90 mL/min/{1.73_m2} (ref >=60)
GLUCOSE RANDOM: 99 mg/dL (ref 70–179)
POTASSIUM: 3.7 mmol/L (ref 3.4–4.8)
SODIUM: 140 mmol/L (ref 135–145)

## 2022-01-04 LAB — BILIRUBIN, TOTAL: BILIRUBIN TOTAL: 0.5 mg/dL (ref 0.3–1.2)

## 2022-01-04 LAB — SEDIMENTATION RATE: ERYTHROCYTE SEDIMENTATION RATE: 7 mm/h (ref 0–15)

## 2022-01-04 LAB — AST: AST (SGOT): 21 U/L (ref <=34)

## 2022-01-04 LAB — C-REACTIVE PROTEIN: C-REACTIVE PROTEIN: <4 mg/L (ref <=10.0)

## 2022-01-04 LAB — ALT: ALT (SGPT): 32 U/L (ref 10–49)

## 2022-01-04 NOTE — Unmapped (Unsigned)
Infectious Diseases Clinic          Assessment/Plan:     HIV  Overall doing well re: HIV. He never misses doses of Biktarvy (BIC/FTC/TAF).    Accesses meds via through insurance.     CD4 counts have not been over 300 for >2Y on suppressive ART.   CD4 rechecks cannot be annual.    Discussed ARV adherence and U=U (tx as prevention). Discussed LAI, gave pt information but would like to discuss with his wife, particularly given the need to travel back to the clinic so frequently    Lab Results   Component Value Date    ACD4 897 05/25/2021    CD4 39 05/25/2021    HIVRS Not Detected 05/25/2021    HIVCP 48 (H) 12/14/2020       ??? Continue current therapy  ??? HIV RNA and safety labs (brief return panel)  ??? Encouraged continued excellent ARV adherence.  ??? GERD still present but improving. Now off of SSRI (previously though to possibly be exacerbating). Says he is doing fine with omeprazole     Anxiety and depression - improving  Related to his HIV diagnosis though had h/o depression in college. Extensive work-up for various symptoms that have all ultimately improved and were likely related to underlying mood d/o including MRI brain, CT neck, and ziopatch. Discontinued SSRI ~Sep 2022    - CTM    Costochondritis pain  Reports pain will come at any time, sometimes daily. No triggers - comes at rest and sometimes when exerting self. No clear resolving factors - sometimes will take 200mg  ibuprofen which dulls it. No history of prior similar pain before HIV diagnosis.   - reassuring cardiac work-up previously and if anything the symptoms may be improving from prior. Normal cardiac exam on today's exam. 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     COVID booster - scheduled for 2 weeks    Weight gain  Attributes to poor diet over the holidays. Is trying to work out more and watching what he is eating.   - CTM, consider RD referral    Nicotine dependence  He reports that he has never smoked. He has never used smokeless tobacco.      Sexual health & secondary prevention  Sex with cis women.  Having sex only with primary partner.   Since last visit, vaginal sex and has not had add'l STI screening.   He does routinely discuss status with partner(s). Never uses condoms.    Not interested in having children. Wife not interested    Lab Results   Component Value Date    RPR Nonreactive 12/14/2020       ??? GC/CT NAATs -- not being checked routinely for this patient  ??? RPR -- not being checked routinely for this patient      Health maintenance  *01/04/22 weight with steel toed boots.   Wt Readings from Last 5 Encounters:   01/04/22 (!) 108 kg (238 lb)   05/25/21 (!) 101.2 kg (223 lb)   10/20/20 89.9 kg (198 lb 4.8 oz)   09/14/20 82.7 kg (182 lb 6.4 oz)   08/10/20 89.4 kg (197  lb 3.2 oz)     Oral health  He does  have a dentist. Last dental exam in past 12 months.    Eye health  He does  use corrective lenses. Last eye exam in past 12 months.    Metabolic conditions  Lab Results   Component Value Date    CREATININE 0.94 05/25/2021    A1C 4.8 05/25/2021     # Diabetes - managed through primary care  # Kidney health - no clinical indication for UA  # Bone health - assessment not yet needed    Communicable diseases  Lab Results   Component Value Date    HEPCAB Nonreactive 06/22/2020     # TB screening - IGRA today  # HCV screening - negative Ab; rescreen w/Ab q1-2y    Cancer screening  No results found for: PSASCRN, PSA, PAP, FINALDX  # Breast - not applicable  # Cervical - not applicable    # Anorectal - not yet needed - under age 53 (not MSM)  # Colorectal - not yet needed  #  Liver - not applicable  # Lung - not applicable  # Prostate - not applicable    Cardiovascular disease  No results found for: CHOL, HDL, LDL, NONHDL, TRIG  # The ASCVD Risk score (Arnett DK, et al., 2019) failed to calculate.  - is not taking aspirin   - is not taking statin  - BP control excellent   - never smoker    Immunization History   Administered Date(s) Administered   ??? COVID-19 VACC,MRNA,(PFIZER)(PF) 03/09/2020, 03/30/2020, 10/15/2020   ??? DTaP 07/26/1998   ??? DTaP, Unspecified Formulation 09/19/1993, 10/21/1993, 08/18/1994   ??? Hepatitis B Vaccine, Unspecified Formulation 10/21/1993, 01/23/1994, 05/04/1994   ??? HiB, unspecified 08/30/1993, 10/21/1993, 02/22/1994, 08/18/1994   ??? Influenza Virus Vaccine, unspecified formulation 10/13/2021   ??? MMR 08/18/1994, 07/26/1998   ??? Meningococcal Conjugate MCV4P 01/19/2011   ??? OPV 07/26/1998   ??? Pneumococcal Conjugate 13-Valent 05/25/2021   ??? Polio Virus Vaccine, Unspecified Formulation 08/30/1993, 10/21/1993, 01/23/1994   ??? Td (adult) unspecified formulation 04/11/2006   Tetanus vaccine 2017 pre college    Lab Results   Component Value Date    HEPAIGG Nonreactive 06/22/2020    HEPBSAB Reactive (A) 06/22/2020       ??? Screening ordered today: none  ??? Immunizations ordered today: none    I personally spent 46 minutes face-to-face and non-face-to-face in the care of this patient, which includes all pre, intra, and post visit time on the date of service.  All documented time was specific to the E/M visit and does not include any procedures that may have been performed.    Disposition    Return to clinic 5-6 months or sooner if needed.    To do @ next RTC    ??? Weight gain discussion  ??? F/u interest in LAI  ??? costochonditis eval                Chief Complaint   Follow-up HIV    HPI  In addition to details in A&P above:    No missed doses.    Overall continues to feel better - mood is improving, anxiety under much better control. Off all meds. See above problem list    Costochondritis symptoms - can be with or without exertion. Has dealt with that issue before - does feel lit is somewhat stress related but despite resolution of stress does still experience these symptoms.  Social History  Housing - in house in [place] with wife, also has pets (dog, fish, snake). Mom and dad live in [place], extended family live in [place].   School / Work - Not in school. Employed (collects specimens for laboratory, hiking in woods/streams etc.)    Tobacco - He reports that he has never smoked. He has never used smokeless tobacco.   Alcohol - special occassions    Substance use - none      Review of Systems  As per HPI. All others negative.      Medications and Allergies  He has a current medication list which includes the following prescription(s): biktarvy, omeprazole, benzonatate, fexofenadine, fluvoxamine, paxlovid co-pack (eua), and vortioxetine.    Allergies: Patient has no known allergies.      Family History  His family history is not on file.                   BP 110/64 (BP Site: L Arm, BP Position: Sitting, BP Cuff Size: Large)  - Pulse 75  - Temp 37.2 ??C (98.9 ??F) (Oral)  - Wt (!) 108 kg (238 lb)  - BMI 30.56 kg/m??      Const looks well and attentive, alert, appropriate   Eyes sclerae anicteric, noninjected OU   ENT Adequate dentition   Lymph No cervical or supraclavidular LAD   CV RRR no mrg   Resp nl wob, speaking in full sentences   GI    GU deferred   Rectal deferred   Skin no rashes on limited exam (face/neck) w video visit   MSK Some ttp over R upper sternum, otherwise all joint w full ROM, no swelling, no pain   Neuro MAEE, non-focal   Psych Flat affect though somewhat more engaged than prior exam. Eye contact good. Linear thoughts. Fluent speech. Somewhat withdrawn, stable demeanor from prior          [id number] occassions    Substance use - none      Review of Systems  As per HPI. All others negative.      Medications and Allergies  He has a current medication list which includes the following prescription(s): biktarvy, omeprazole, benzonatate, fexofenadine, fluvoxamine, paxlovid co-pack (eua), and vortioxetine.    Allergies: Patient has no known allergies.      Family History  His family history is not on file.                   BP 110/64 (BP Site: L Arm, BP Position: Sitting, BP Cuff Size: Large)  - Pulse 75  - Temp 37.2 ??C (98.9 ??F) (Oral)  - Wt (!) 108 kg (238 lb)  - BMI 30.56 kg/m??      Const looks well and attentive, alert, appropriate   Eyes sclerae anicteric, noninjected OU   ENT    Lymph    CV    Resp nl wob, speaking in full sentences   GI    GU deferred   Rectal deferred   Skin no rashes on limited exam (face/neck) w video visit   MSK    Neuro    Psych Flat affect. Eye contact good. Linear thoughts. Fluent speech. Somewhat withdrawn, stable demeanor from prior          [id number]

## 2022-01-05 LAB — QUANTIFERON TB GOLD PLUS
QUANTIFERON ANTIGEN 1 MINUS NIL: 0.02 [IU]/mL
QUANTIFERON ANTIGEN 2 MINUS NIL: 0.02 [IU]/mL
QUANTIFERON MITOGEN: 9.97 [IU]/mL
QUANTIFERON TB GOLD PLUS: NEGATIVE
QUANTIFERON TB NIL VALUE: 0.03 [IU]/mL

## 2022-01-05 LAB — TB MITOGEN: TB MITOGEN VALUE: 10

## 2022-01-05 LAB — LYMPH MARKER LIMITED,FLOW
ABSOLUTE CD3 CNT: 1680 {cells}/uL (ref 915–3400)
ABSOLUTE CD4 CNT: 936 {cells}/uL (ref 510–2320)
ABSOLUTE CD8 CNT: 696 {cells}/uL (ref 180–1520)
CD3% (T CELLS): 70 % (ref 61–86)
CD4% (T HELPER): 39 % (ref 34–58)
CD4:CD8 RATIO: 1.3 (ref 0.9–4.8)
CD8% T SUPPRESR: 29 % (ref 12–38)

## 2022-01-05 LAB — TB AG1: TB AG1 VALUE: 0.05

## 2022-01-05 LAB — TB AG2: TB AG2 VALUE: 0.05

## 2022-01-05 LAB — TB NIL: TB NIL VALUE: 0.03

## 2022-01-05 NOTE — Unmapped (Signed)
It was great to see you today.    The ID clinic phone number is [phone].  The ID clinic fax number is [phone].    Please note that your laboratory and other results may be visible to you in real time, possibly before they reach your provider. Please allow 48 hours for clinical interpretation of these results. Importantly, even if a result is flagged as abnormal, it may not be one that impacts your health.    For urgent issues on nights and weekends you may reach the ID Physician on call through the [facility] Operator at [phone].     URGENT CARE  Please call ahead to speak with the nursing staff if you are in need of an urgent appointment.       MEDICATIONS  For refills please contact your pharmacy and ask them to electronically send or fax the request to the clinic.   Please bring all medications in original bottles to every appointment.    HMAP (formerly ADAP) or [employer] Eligibility (required even if you do not receive medication through [facility])  Please remember to renew your Juanell Fairly eligibility during renewal periods which occur twice a year: January-March and July-September.     The following are needed for each renewal:   - [facility] Identification (if you don't have one, then a bill with your name and address in [place])   - proof of income (award letter, W-2, or last three check stubs)   If you are unable to come in for renewal, let us know if we can mail, fax or e-mail paperwork to you.   HMAP Contact: [phone].     Terrall Laity, MD  [facility] at [facility]  [address]   [place], [place] [address]  Phone: [phone]   Fax: [phone]     Lab info:  Your most recent CD4 T-cell counts and viral loads are below. Here are a few things to keep in mind when looking at your numbers:  Our goal is to get your virus to be undetectable and keep it undetectable. If the virus is undetectable you are much more likely to stay healthy.  We consider your viral load to be undetectable if it says <40 or if it says Not detected.  For most people, we're checking CD4 counts every other visit (once or twice a year, or sometimes even less).  It's normal for your CD4 count to be different from visit to visit.   You can help by taking your medications at about the same time, every single day. If you're having trouble with taking your medications, it's important to let us know.    Lab Results   Component Value Date    ACD4 897 05/25/2021    CD4 39 05/25/2021    HIVCP 48 (H) 12/14/2020    HIVRS Not Detected 05/25/2021

## 2022-01-05 NOTE — Unmapped (Signed)
Name: Austin Thompson  Date: 01/05/2022  Address: [address]  [place] [address]   [place] of Residence:  [facility]  Phone: [phone]     Started assessment with patient options: in clinic     Is this the same address for mailing? Yes  If No, Mailing Address is:     Housing Public house manager    Tax Press photographer Status  Married, filing jointly    Employment Status  Employed Full Time    Income  Salary/Wages    If no or low income, how are you meeting your basic needs?  Not Applicable    List Tax Household Members including relationship to you:   n/a    Someone in my household receives: Agricultural engineer who: [name] (Spouse)    Do you have a current diagnosis for Hepatitis C?  Lab Results   Component Value Date    HEPCAB Nonreactive 06/22/2020       Have you used tobacco products four or more times per week in the last six months?  No    Teacher, adult education  Patient has affordable insurance through [employer], [place], and or Employment and is not eligible.    Patient given ACA education if they qualified based on answers to questions above.     Patient was informed of the following programs;   N/A    The following applications/handouts were given to patient:   N/A    The following forms were also started with the patient:   N/A    Juanell Fairly application status: Incomplete; patient needs to send proof of income    Patient is applying for [employer] on Charges Only     Mickle Asper,  Benefits & Eligibility Coordinator  Time of Intervention: 5 minutes

## 2022-01-06 LAB — HIV RNA, QUANTITATIVE, PCR: HIV RNA QNT RSLT: NOT DETECTED

## 2022-01-11 NOTE — Unmapped (Signed)
[facility] Pharm[facility] Refill Coordination Note    Specialty Medication(s) to be Shipped:   Infectious Disease: Biktarvy    Other medication(s) to be shipped: No additional medications requested for fill at this time     Freddy Kinne, DOB: Oct 01, 1991  Phone: [phone] (home)       All above HIPAA information was verified with patient.     Was a Nurse, learning disability used for this call? No    Completed refill call assessment today to schedule patient's medication shipment from the [facility] [phone]).  All relevant notes have been reviewed.     Specialty medication(s) and dose(s) confirmed: Regimen is correct and unchanged.   Changes to medications: Aja reports no changes at this time.  Changes to insurance: No  New side effects reported not previously addressed with a pharmacist or physician: None reported  Questions for the pharmacist: No    Confirmed patient received a Conservation officer, historic buildings and a Surveyor, mining with first shipment. The patient will receive a drug information handout for each medication shipped and additional FDA Medication Guides as required.       DISEASE/MEDICATION-SPECIFIC INFORMATION        N/A    SPECIALTY MEDICATION ADHERENCE     Medication Adherence    Patient reported X missed doses in the last month: 0  Specialty Medication: Biktarvy 50-200-25mg   Patient is on additional specialty medications: No  Patient is on more than two specialty medications: No              Were doses missed due to medication being on hold? No    biktarvy  : 10 days of medicine on hand       REFERRAL TO PHARMACIST     Referral to the pharmacist: Not needed      [facility]     Shipping address confirmed in Epic.     Delivery Scheduled: Yes, Expected medication delivery date: 1/20.     Medication will be delivered via Next Day Courier to the prescription address in Epic WAM.    [name]   [facility] Specialty Technician

## 2022-01-12 MED FILL — BIKTARVY 50 MG-200 MG-25 MG TABLET: ORAL | 30 days supply | Qty: 30 | Fill #1

## 2022-02-03 DIAGNOSIS — B20 Human immunodeficiency virus [HIV] disease: Principal | ICD-10-CM

## 2022-02-03 MED ORDER — BIKTARVY 50 MG-200 MG-25 MG TABLET
ORAL_TABLET | Freq: Every day | ORAL | 1 refills | 30.00000 days
Start: 2022-02-03 — End: ?

## 2022-02-03 NOTE — Unmapped (Signed)
[facility] Refill Coordination Note    Specialty Medication(s) to be Shipped:   Infectious Disease: Biktarvy    Other medication(s) to be shipped: No additional medications requested for fill at this time     Austin Thompson, DOB: [birth year]  Phone: [phone] (home)       All above HIPAA information was verified with patient.     Was a Nurse, learning disability used for this call? No    Completed refill call assessment today to schedule patient's medication shipment from the [facility] [phone]).  All relevant notes have been reviewed.     Specialty medication(s) and dose(s) confirmed: Regimen is correct and unchanged.   Changes to medications: Austin Thompson reports no changes at this time.  Changes to insurance: No  New side effects reported not previously addressed with a pharmacist or physician: None reported  Questions for the pharmacist: No    Confirmed patient received a Conservation officer, historic buildings and a Surveyor, mining with first shipment. The patient will receive a drug information handout for each medication shipped and additional FDA Medication Guides as required.       DISEASE/MEDICATION-SPECIFIC INFORMATION        N/A    SPECIALTY MEDICATION ADHERENCE     Medication Adherence    Patient reported X missed doses in the last month: 0  Specialty Medication: biktarvy              Were doses missed due to medication being on hold? No    biktarvy  : 10 days of medicine on hand       REFERRAL TO PHARMACIST     Referral to the pharmacist: Not needed      [facility]     Shipping address confirmed in Epic.     Delivery Scheduled: Yes, Expected medication delivery date: 2/16.  However, Rx request for refills was sent to the provider as there are none remaining.     Medication will be delivered via Next Day Courier to the prescription address in Epic WAM.    Austin Thompson   [facility] Specialty Technician

## 2022-02-06 ENCOUNTER — Telehealth: Payer: Self-pay | Admitting: Urology

## 2022-02-06 DIAGNOSIS — B20 Human immunodeficiency virus [HIV] disease: Principal | ICD-10-CM

## 2022-02-06 MED ORDER — BIKTARVY 50 MG-200 MG-25 MG TABLET
ORAL_TABLET | Freq: Every day | ORAL | 0 refills | 30 days | Status: CP
Start: 2022-02-06 — End: ?
  Filled 2022-02-08: qty 30, 30d supply, fill #0

## 2022-02-06 NOTE — Unmapped (Signed)
Medication:  bictegrav-emtricit-tenofov ala (BIKTARVY) 50-200-25 mg tablet     Disp: 30 tab / R:0  Last Refill: 01/12/2022  Last Visit: 01/04/2022  Follow up: 05/31/2022    Per provider note:  HIV  Overall doing well re: HIV. He never misses doses of Biktarvy (BIC/FTC/TAF).

## 2022-02-06 NOTE — Telephone Encounter (Signed)
Pt is scheduled for a vas and said he was told to call in a week before the procedure to get a prescription for some medication he may need. Not sure what it is, but he would like a call back if possible.

## 2022-02-07 MED ORDER — DIAZEPAM 10 MG PO TABS: ORAL_TABLET | ORAL | 0 refills | Status: DC

## 2022-02-07 NOTE — Telephone Encounter (Signed)
Pt returned call and I let him know RX for valium had been sent to pharmacy and he would need a driver for vasectomy.

## 2022-02-07 NOTE — Telephone Encounter (Signed)
Rx Valium sent.  He will need a driver if using this medication

## 2022-02-07 NOTE — Telephone Encounter (Signed)
Notified patient as instructed,.  

## 2022-02-10 ENCOUNTER — Other Ambulatory Visit: Payer: Self-pay

## 2022-02-10 ENCOUNTER — Encounter: Payer: Self-pay | Admitting: Urology

## 2022-02-10 ENCOUNTER — Ambulatory Visit (INDEPENDENT_AMBULATORY_CARE_PROVIDER_SITE_OTHER): Payer: BC Managed Care – PPO | Admitting: Urology

## 2022-02-10 VITALS — BP 136/75 | HR 79 | Ht 74.0 in | Wt 219.0 lb

## 2022-02-10 DIAGNOSIS — Z302 Encounter for sterilization: Secondary | ICD-10-CM | POA: Diagnosis not present

## 2022-02-10 MED ORDER — HYDROCODONE-ACETAMINOPHEN 5-325 MG PO TABS: 1.0000 | ORAL_TABLET | Freq: Four times a day (QID) | ORAL | 0 refills | Status: DC | PRN

## 2022-02-10 NOTE — Progress Notes (Signed)
Vasectomy Procedure Note  Indications: Craig Hicks is a 31 y.o. male who presents today for elective sterilization.  He has been consented for the procedure.  He is aware of the risks and benefits.  He had no additional questions.  He agrees to proceed.  He denies any other significant change since his last visit.  Pre-operative Diagnosis: Elective sterilization  Post-operative Diagnosis: Elective sterilization  Premedication: Valium 10 mg po  Surgeon: Lorin Picket C. Masiyah Engen, M.D  Description: The patient was prepped and draped in the standard fashion.  The right vas deferens was identified and brought superiorly to the anterior scrotal skin.  The skin and vas were then anesthetized utilizing 6 ml 1% lidocaine.  A small stab incision was made and spread with the vas dissector.  The vas was grasped utilizing the vas clamp and elevated out of the incision.  The vas was dissected free from surrounding tissue and vessels and an ~1 cm segment was excised.  The vas lumens were cauterized utilizing electrocautery.  The distal segment was buried in the surrounding sheath with a 3-0 chromic suture.  No significant bleeding was observed.  The vas ends were then dropped back into the hemiscrotum.  The skin was closed with hemostatic pressure.  An identical procedure was performed on the contralateral side.  Clean dry gauze was applied to the incision sites.  The patient tolerated the procedure well.  Complications:None  Recommendations: 1.  No lifting greater than 10 pounds or strenuous activity for 1 week. 2.  Scrotal support for 1-2 weeks. 3.  May shower in 24 hours; no bath, hot tub for 1 week 4.  No intercourse for at least 7 days and resume based on level of discomfort  5.  Continue alternate contraception for 12 weeks.  6.  Call for significant pain, swelling, redness, drainage or fever greater than 100.5. 7.  Rx hydrocodone/APAP 5/325 1-2 every 6 hours prn pain. 8.  Follow-up semen analysis in 12  weeks.   Irineo Axon, MD

## 2022-02-10 NOTE — Patient Instructions (Signed)

## 2022-02-27 ENCOUNTER — Ambulatory Visit: Payer: BC Managed Care – PPO | Admitting: Internal Medicine

## 2022-03-03 DIAGNOSIS — B20 Human immunodeficiency virus [HIV] disease: Principal | ICD-10-CM

## 2022-03-03 MED ORDER — BIKTARVY 50 MG-200 MG-25 MG TABLET
ORAL_TABLET | Freq: Every day | ORAL | 0 refills | 30 days
Start: 2022-03-03 — End: ?

## 2022-03-03 NOTE — Unmapped (Signed)
[facility] Pharm[facility] Clinical Assessment & Refill Coordination Note    Austin Thompson, DOB: 09/07/1991  Phone: [phone] (home)     All above HIPAA information was verified with patient.     Was a Nurse, learning disability used for this call? No    Specialty Medication(s):   Infectious Disease: Biktarvy     Current Outpatient Medications   Medication Sig Dispense Refill   ??? benzonatate (TESSALON) 100 MG capsule TAKE 1 CAPSULE BY MOUTH THREE TIMES A DAY AS NEEDED FOR COUGH FOR UP TO 7 DAYS (Patient not taking: Reported on 01/04/2022)     ??? bictegrav-emtricit-tenofov ala (BIKTARVY) 50-200-25 mg tablet Take 1 tablet by mouth daily. 30 tablet 0   ??? fexofenadine (ALLEGRA) 180 MG tablet Take 180 mg by mouth daily. (Patient not taking: Reported on 01/04/2022)     ??? fluvoxaMINE (LUVOX) 100 MG tablet  (Patient not taking: Reported on 01/04/2022)     ??? omeprazole (PRILOSEC) 20 MG capsule Take 20 mg by mouth daily.     ??? PAXLOVID CO-PACK, EUA, 150 mg x 2- 100 mg tablet FOLLOW PACKAGE DIRECTIONS (Patient not taking: Reported on 01/04/2022)     ??? vortioxetine (TRINTELLIX) 20 mg tablet Take 20 mg by mouth. Once daily (Patient not taking: Reported on 01/04/2022)       No current facility-administered medications for this visit.        Changes to medications: Tyshun reports no changes at this time.    No Known Allergies    Changes to allergies: No    SPECIALTY MEDICATION ADHERENCE     Biktarvy 50-200-25 mg: 10 days of medicine on hand       Specialty medication(s) dose(s) confirmed: Regimen is correct and unchanged.     Are there any concerns with adherence? No    Adherence counseling provided? Not needed    CLINICAL MANAGEMENT AND INTERVENTION      Clinical Benefit Assessment:    Do you feel the medicine is effective or helping your condition? Yes    HIV ASSOCIATED LABS:     Lab Results   Component Value Date/Time    HIVRS Not Detected 01/04/2022 05:16 PM    HIVRS Not Detected 05/25/2021 04:16 PM    HIVRS Detected (A) 08/10/2020 08:53 AM    HIVCP 48 (H) 12/14/2020 08:35 AM    HIVCP <40 (H) 08/10/2020 08:53 AM    HIVCP 35,300 (H) 06/22/2020 12:51 PM    ACD4 936 01/04/2022 05:16 PM    ACD4 897 05/25/2021 04:16 PM    ACD4 640 12/14/2020 08:35 AM       Clinical Benefit counseling provided? Labs from 01/04/22 show evidence of clinical benefit    Adverse Effects Assessment:    Are you experiencing any side effects? No    Are you experiencing difficulty administering your medicine? No    Quality of Life Assessment:    How many days over the past month did your HIV  keep you from your normal activities? For example, brushing your teeth or getting up in the morning. 0    Have you discussed this with your provider? Not needed    Acute Infection Status:    Acute infections noted within Epic:  No active infections  Patient reported infection: None    Therapy Appropriateness:    Is therapy appropriate and patient progressing towards therapeutic goals? Yes, therapy is appropriate and should be continued    DISEASE/MEDICATION-SPECIFIC INFORMATION      N/A  PATIENT SPECIFIC NEEDS     - Does the patient have any physical, cognitive, or cultural barriers? No    - Is the patient high risk? No    - Does the patient require a Care Management Plan? No       SHIPPING     Specialty Medication(s) to be Shipped:   Infectious Disease: Biktarvy    Other medication(s) to be shipped: No additional medications requested for fill at this time     Changes to insurance: No    Delivery Scheduled: Yes, Expected medication delivery date: 03/08/22.  However, Rx request for refills was sent to the provider as there are none remaining.     Medication will be delivered via Same Day Courier to the confirmed prescription address in [facility].    The patient will receive a drug information handout for each medication shipped and additional FDA Medication Guides as required.  Verified that patient has previously received a Conservation officer, historic buildings and a Surveyor, mining.    The patient or caregiver noted above participated in the development of this care plan and knows that they can request review of or adjustments to the care plan at any time.      All of the patient's questions and concerns have been addressed.    [name]   [facility] Specialty Pharmacist

## 2022-03-04 MED ORDER — BIKTARVY 50 MG-200 MG-25 MG TABLET
ORAL_TABLET | Freq: Every day | ORAL | 4 refills | 30 days | Status: CP
Start: 2022-03-04 — End: ?
  Filled 2022-03-08: qty 30, 30d supply, fill #0

## 2022-03-05 NOTE — Unmapped (Signed)
Medication Requested: Austin Thompson      Last Office Visit: 01/04/2022  Next Office Visit: 06/28/2022  Per Provider Note:   Overall doing well re: HIV. He never misses doses of Biktarvy  Standing order protocol requirements met?: yes    Sent [id number]    Days Supply Given: 30  Number of Refills: 4

## 2022-04-07 NOTE — Unmapped (Signed)
[facility] Pharmacy Refill Coordination Note    Specialty Medication(s) to be Shipped:   Infectious Disease: Biktarvy    Other medication(s) to be shipped: No additional medications requested for fill at this time     Austin Thompson, DOB: 12-14-1991  Phone: [phone] (home)       All above HIPAA information was verified with patient.     Was a Nurse, learning disability used for this call? No    Completed refill call assessment today to schedule patient's medication shipment from the [facility] [phone]).  All relevant notes have been reviewed.     Specialty medication(s) and dose(s) confirmed: Regimen is correct and unchanged.   Changes to medications: Austin Thompson reports no changes at this time.  Changes to insurance: No  New side effects reported not previously addressed with a pharmacist or physician: None reported  Questions for the pharmacist: No    Confirmed patient received a Conservation officer, historic buildings and a Surveyor, mining with first shipment. The patient will receive a drug information handout for each medication shipped and additional FDA Medication Guides as required.       DISEASE/MEDICATION-SPECIFIC INFORMATION        N/A    SPECIALTY MEDICATION ADHERENCE     Medication Adherence    Patient reported X missed doses in the last month: 0  Specialty Medication: biktarvy              Were doses missed due to medication being on hold? No    biktarvy  : 10 days of medicine on hand       REFERRAL TO PHARMACIST     Referral to the pharmacist: Not needed      [facility]     Shipping address confirmed in Epic.     Delivery Scheduled: Yes, Expected medication delivery date: 4/19.     Medication will be delivered via Next Day Courier to the prescription address in Epic WAM.    Austin Thompson   [facility] Specialty Technician

## 2022-04-11 MED FILL — BIKTARVY 50 MG-200 MG-25 MG TABLET: ORAL | 30 days supply | Qty: 30 | Fill #1

## 2022-05-05 NOTE — Unmapped (Signed)
[facility] Refill Coordination Note    Specialty Medication(s) to be Shipped:   Infectious Disease: Biktarvy    Other medication(s) to be shipped: No additional medications requested for fill at this time     Austin Thompson, DOB: Sep 05, 1991  Phone: [phone] (home)       All above HIPAA information was verified with patient.     Was a Nurse, learning disability used for this call? No    Completed refill call assessment today to schedule patient's medication shipment from the [facility] [phone]).  All relevant notes have been reviewed.     Specialty medication(s) and dose(s) confirmed: Regimen is correct and unchanged.   Changes to medications: [name] reports no changes at this time.  Changes to insurance: No  New side effects reported not previously addressed with a pharmacist or physician: None reported  Questions for the pharmacist: No    Confirmed patient received a Conservation officer, historic buildings and a Surveyor, mining with first shipment. The patient will receive a drug information handout for each medication shipped and additional FDA Medication Guides as required.       DISEASE/MEDICATION-SPECIFIC INFORMATION        N/A    SPECIALTY MEDICATION ADHERENCE     Medication Adherence    Patient reported X missed doses in the last month: 0  Specialty Medication: BIKTARVY  Patient is on additional specialty medications: No              Were doses missed due to medication being on hold? No    Biktarvy 50-200-25 mg: 10 days of medicine on hand        REFERRAL TO PHARMACIST     Referral to the pharmacist: Not needed      [facility]     Shipping address confirmed in Epic.     Delivery Scheduled: Yes, Expected medication delivery date: 05/10/22.     Medication will be delivered via Next Day Courier to the prescription address in Epic WAM.    Unk Lightning   [facility] Specialty Technician

## 2022-05-09 MED FILL — BIKTARVY 50 MG-200 MG-25 MG TABLET: ORAL | 30 days supply | Qty: 30 | Fill #2

## 2022-05-11 ENCOUNTER — Other Ambulatory Visit: Payer: BC Managed Care – PPO

## 2022-05-11 DIAGNOSIS — Z9852 Vasectomy status: Secondary | ICD-10-CM

## 2022-05-12 LAB — POST-VAS SPERM EVALUATION,QUAL: Volume: 1.6 mL

## 2022-05-26 ENCOUNTER — Encounter: Payer: Self-pay | Admitting: Internal Medicine

## 2022-05-26 ENCOUNTER — Ambulatory Visit: Payer: BC Managed Care – PPO | Admitting: Internal Medicine

## 2022-05-26 VITALS — BP 114/78 | HR 89 | Temp 98.3°F | Resp 16 | Ht 74.0 in | Wt 211.6 lb

## 2022-05-26 DIAGNOSIS — B20 Human immunodeficiency virus [HIV] disease: Secondary | ICD-10-CM | POA: Diagnosis not present

## 2022-05-26 DIAGNOSIS — H5319 Other subjective visual disturbances: Secondary | ICD-10-CM

## 2022-05-26 DIAGNOSIS — F411 Generalized anxiety disorder: Secondary | ICD-10-CM

## 2022-05-26 MED ORDER — ESCITALOPRAM OXALATE 5 MG PO TABS: 5.0000 mg | ORAL_TABLET | Freq: Every day | ORAL | 1 refills | Status: DC

## 2022-05-26 MED ORDER — HYDROXYZINE HCL 10 MG PO TABS: 10.0000 mg | ORAL_TABLET | Freq: Three times a day (TID) | ORAL | 0 refills | Status: DC | PRN

## 2022-05-26 NOTE — Assessment & Plan Note (Signed)
Following with infectious disease at [facility], note reviewed from 01/04/2022.  He is currently on Biktarvy 50-200-25 mg daily.

## 2022-05-26 NOTE — Assessment & Plan Note (Signed)
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  Presents as visual floaters.  Does follow with ophthalmology for this.  Occasionally will get migraines as a result.

## 2022-05-26 NOTE — Patient Instructions (Addendum)
It was great seeing you today!  Plan discussed at today's visit: -Lexapro 5 mg sent to pharmacy, take daily -Can take Hydroxyzine as needed for anxiety   Follow up in: 6 weeks   Take care and let us know if you have any questions or concerns prior to your next visit.  Dr. Rosana Berger

## 2022-05-26 NOTE — Assessment & Plan Note (Addendum)
Usually revolves around his health.  We will start Lexapro 5 mg daily to help stabilize moods and will use hydroxyzine as needed.  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  Patient understand and agrees.  Doing well with therapy, plans to continue. Follow up in 6 weeks for recheck.

## 2022-05-26 NOTE — Progress Notes (Signed)
New Patient Office Visit  Subjective    Patient ID: Craig Hicks, male    DOB: 05/21/1991  Age: 31 y.o. MRN: [id number]  CC:  Chief Complaint  Patient presents with   Establish Care   Anxiety    Since dx w/ HIV    HPI Craig Nonesdrian Staunton presents to establish care. Chronic medical conditions include HIV, he is currently on Biktarvy 50-200-25 mg daily. He [facility] at [facility]. Last seen in January, note reviewed from 01/04/22. Last viral loads were checked in 6/22. First diagnosed in 2021.   Vitamin D Deficiency: -Currently on 5000 IU daily -Vitamin D was last checked several years ago and was low at that time.  Anxiety: -Since he was diagnosed with a bad case of COVID in 2021 and then HIV, he has developed anxiety around his health.  He is currently seeing a therapist and is doing very well with this.  He is still having intrusive anxious thoughts on a daily basis.  He does experience diaphoresis, palpitations, lightheadedness when he becomes overwhelmed with his thoughts.  This happens at least once a day.  He does have more severe panic attacks on a weekly basis currently.  He states this is actually improved and it had been more frequent in the past.  He had been treated with multiple assess RI's and SNRIs in the past for depression.  He states he feels like his depressive symptoms are well controlled stressed anxiety that is bothering him currently.  His most recent medication was Klonopin, which she states he did well with but understands that this is a habit-forming medication.     05/26/2022    2:45 PM  Depression screen PHQ 2/9  Decreased Interest 1  Down, Depressed, Hopeless 0  PHQ - 2 Score 1  Altered sleeping 2  Tired, decreased energy 1  Change in appetite 0  Feeling bad or failure about yourself  0  Trouble concentrating 2  Moving slowly or fidgety/restless 1  Suicidal thoughts 0  PHQ-9 Score 7  Difficult doing work/chores Not difficult at all   Health Maintenance: -Blood  work up to date     Outpatient Encounter Medications as of 05/26/2022  Medication Sig   bictegravir-emtricitabine-tenofovir AF (BIKTARVY) 50-200-25 MG TABS tablet Take 1 tablet by mouth daily.   Cholecalciferol (D3 5000) 125 MCG (5000 UT) capsule    escitalopram (LEXAPRO) 5 MG tablet Take 1 tablet (5 mg total) by mouth daily.   hydrOXYzine (ATARAX) 10 MG tablet Take 1 tablet (10 mg total) by mouth 3 (three) times daily as needed.   [DISCONTINUED] diazepam (VALIUM) 10 MG tablet 1 tab po 30 min prior to procedure   [DISCONTINUED] HYDROcodone-acetaminophen (NORCO/VICODIN) 5-325 MG tablet Take 1 tablet by mouth every 6 (six) hours as needed for moderate pain.   [DISCONTINUED] omeprazole (PRILOSEC) 20 MG capsule Take 20 mg by mouth daily.   No facility-administered encounter medications on file as of 05/26/2022.    Past Medical History:  Diagnosis Date   Anxiety    Depression    GERD (gastroesophageal reflux disease)    HIV infection (HCC)     Past Surgical History:  Procedure Laterality Date   EXTERNAL EAR SURGERY     ears pinned back   VASECTOMY      Family History  Problem Relation Age of Onset   Anxiety disorder Mother    Cancer Mother    Vision loss Mother    Cancer Father    Vision  loss Father    Alcohol abuse Maternal Grandfather    Diabetes Maternal Grandmother    Obesity Maternal Grandmother    Stroke Maternal Grandmother    Depression Paternal Grandmother     Social History   Socioeconomic History   Marital status: Married    Spouse name: Not on file   Number of children: Not on file   Years of education: Not on file   Highest education level: Not on file  Occupational History   Not on file  Tobacco Use   Smoking status: Never   Smokeless tobacco: Never  Vaping Use   Vaping Use: Never used  Substance and Sexual Activity   Alcohol use: Yes    Alcohol/week: 4.0 standard drinks    Types: 4 Standard drinks or equivalent per week    Comment: occ   Drug  use: Yes    Frequency: 1.0 times per week    Types: Psilocybin, Other-see comments    Comment: CBD   Sexual activity: Yes    Birth control/protection: Condom, I.U.D., Surgical  Other Topics Concern   Not on file  Social History Narrative   Not on file   Social Determinants of Health   Financial Resource Strain: Not on file  Food Insecurity: Not on file  Transportation Needs: Not on file  Physical Activity: Not on file  Stress: Not on file  Social Connections: Not on file  Intimate Partner Violence: Not on file    Review of Systems  Constitutional:  Negative for chills and fever.  Psychiatric/Behavioral:  The patient is nervous/anxious.        Objective    BP 114/78   Pulse 89   Temp 98.3 F (36.8 C)   Resp 16   Ht 6\' 2"  (1.88 m)   Wt 211 lb 9.6 oz (96 kg)   SpO2 100%   BMI 27.17 kg/m   Physical Exam Constitutional:      Appearance: Normal appearance.  HENT:     Head: Normocephalic and atraumatic.     Mouth/Throat:     Mouth: Mucous membranes are moist.     Pharynx: Oropharynx is clear.  Eyes:     Conjunctiva/sclera: Conjunctivae normal.  Cardiovascular:     Rate and Rhythm: Normal rate and regular rhythm.  Pulmonary:     Effort: Pulmonary effort is normal.     Breath sounds: Normal breath sounds.  Abdominal:     General: There is no distension.     Palpations: Abdomen is soft.     Tenderness: There is no abdominal tenderness.  Musculoskeletal:     Right lower leg: No edema.     Left lower leg: No edema.  Skin:    General: Skin is warm and dry.  Neurological:     General: No focal deficit present.     Mental Status: He is alert. Mental status is at baseline.  Psychiatric:        Mood and Affect: Mood normal.        Behavior: Behavior normal.        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  Presents as visual floaters.   Does follow with ophthalmology for this.  Occasionally will get migraines as a result.         Other   Generalized anxiety disorder - Primary  Usually revolves around his health.  We will start Lexapro 5 mg daily to help stabilize moods and will use hydroxyzine as needed.  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  Patient understand and agrees.  Doing well with therapy, plans to continue. Follow up in 6 weeks for recheck.        Relevant Medications   hydrOXYzine (ATARAX) 10 MG tablet   escitalopram (LEXAPRO) 5 MG tablet   Human immunodeficiency virus (HIV) disease (HCC)    Following with infectious disease at [facility], note reviewed from 01/04/2022.  He is currently on Biktarvy 50-200-25 mg daily.        Relevant Medications   bictegravir-emtricitabine-tenofovir AF (BIKTARVY) 50-200-25 MG TABS tablet    Return in about 6 weeks (around 07/07/2022).   Margarita Mail, DO

## 2022-06-07 NOTE — Unmapped (Signed)
[facility] Refill Coordination Note    Specialty Medication(s) to be Shipped:   Infectious Disease: Biktarvy    Other medication(s) to be shipped: No additional medications requested for fill at this time     Austin Thompson, DOB: 1991/08/30  Phone: [phone] (home)       All above HIPAA information was verified with patient.     Was a Nurse, learning disability used for this call? No    Completed refill call assessment today to schedule patient's medication shipment from the [facility] [phone]).  All relevant notes have been reviewed.     Specialty medication(s) and dose(s) confirmed: Regimen is correct and unchanged.   Changes to medications: Noa reports no changes at this time.  Changes to insurance: No  New side effects reported not previously addressed with a pharmacist or physician: None reported  Questions for the pharmacist: No    Confirmed patient received a Conservation officer, historic buildings and a Surveyor, mining with first shipment. The patient will receive a drug information handout for each medication shipped and additional FDA Medication Guides as required.       DISEASE/MEDICATION-SPECIFIC INFORMATION        N/A    SPECIALTY MEDICATION ADHERENCE     Medication Adherence    Patient reported X missed doses in the last month: 0  Specialty Medication: BIKTARVY              Were doses missed due to medication being on hold? No    biktarvy  : 10 days of medicine on hand       REFERRAL TO PHARMACIST     Referral to the pharmacist: Not needed      [facility]     Shipping address confirmed in Epic.     Delivery Scheduled: Yes, Expected medication delivery date: 6/16.     Medication will be delivered via Same Day Courier to the prescription address in Epic WAM.    [name]   [facility] Specialty Technician

## 2022-06-09 MED FILL — BIKTARVY 50 MG-200 MG-25 MG TABLET: ORAL | 30 days supply | Qty: 30 | Fill #3

## 2022-06-28 NOTE — Unmapped (Signed)
Text sent and My Chart message to reschedule with Dr. Juel Burrow

## 2022-07-05 NOTE — Unmapped (Signed)
[facility] Refill Coordination Note    Specialty Medication(s) to be Shipped:   Infectious Disease: Biktarvy    Other medication(s) to be shipped: No additional medications requested for fill at this time     Austin Thompson, DOB: 1991-11-13  Phone: [phone] (home)       All above HIPAA information was verified with patient.     Was a Nurse, learning disability used for this call? No    Completed refill call assessment today to schedule patient's medication shipment from the [facility] [phone]).  All relevant notes have been reviewed.     Specialty medication(s) and dose(s) confirmed: Regimen is correct and unchanged.   Changes to medications: Austin Thompson reports no changes at this time.  Changes to insurance: No  New side effects reported not previously addressed with a pharmacist or physician: None reported  Questions for the pharmacist: No    Confirmed patient received a Conservation officer, historic buildings and a Surveyor, mining with first shipment. The patient will receive a drug information handout for each medication shipped and additional FDA Medication Guides as required.       DISEASE/MEDICATION-SPECIFIC INFORMATION        N/A    SPECIALTY MEDICATION ADHERENCE     Medication Adherence    Patient reported X missed doses in the last month: 0  Specialty Medication: BIKTARVY 50-200-25 mg  Patient is on additional specialty medications: No              Were doses missed due to medication being on hold? No    Biktarvy 50-200-25 mg: 7 days of medicine on hand        REFERRAL TO PHARMACIST     Referral to the pharmacist: Not needed      [facility]     Shipping address confirmed in Epic.     Delivery Scheduled: Yes, Expected medication delivery date: 07/07/22.     Medication will be delivered via Next Day Courier to the prescription address in Epic WAM.    Unk Lightning   [facility] Specialty Technician

## 2022-07-06 MED FILL — BIKTARVY 50 MG-200 MG-25 MG TABLET: ORAL | 30 days supply | Qty: 30 | Fill #4

## 2022-07-07 ENCOUNTER — Ambulatory Visit: Payer: BC Managed Care – PPO | Admitting: Internal Medicine

## 2022-07-07 ENCOUNTER — Encounter: Payer: Self-pay | Admitting: Internal Medicine

## 2022-07-07 VITALS — BP 122/64 | HR 88 | Temp 98.2°F | Resp 16 | Ht 74.0 in | Wt 203.0 lb

## 2022-07-07 DIAGNOSIS — F411 Generalized anxiety disorder: Secondary | ICD-10-CM

## 2022-07-07 DIAGNOSIS — R0981 Nasal congestion: Secondary | ICD-10-CM | POA: Diagnosis not present

## 2022-07-07 MED ORDER — ESCITALOPRAM OXALATE 10 MG PO TABS: 10.0000 mg | ORAL_TABLET | Freq: Every day | ORAL | 0 refills | Status: DC

## 2022-07-07 NOTE — Patient Instructions (Signed)
It was great seeing you today!  Plan discussed at today's visit: -Increase Lexapro to 10 mg daily -Try Ryaltris nasal spray 2 sprays on each side twice a day  Follow up in: 3 months   Take care and let us know if you have any questions or concerns prior to your next visit.  Dr. Caralee Ates

## 2022-07-07 NOTE — Progress Notes (Signed)
Established Patient Office Visit  Subjective    Patient ID: Craig Hicks, male    DOB: 1991/09/07  Age: 31 y.o. MRN: [id number]  CC:  Chief Complaint  Patient presents with   Follow-up   Anxiety    HPI Craig Hicks presents for follow-up.  HIV: -Currently on Biktarvy 50-200-25 mg daily -He follows with ID at [facility]. Last seen in January, note reviewed from 01/04/22.  -Last viral loads were checked in 6/22. First diagnosed in 2021.   Vitamin D Deficiency: -Currently on 5000 IU daily -Vitamin D was last checked several years ago and was low at that time.  Anxiety: -Since he was diagnosed with a bad case of COVID in 2021 and then HIV, he has developed anxiety around his health.   -He is currently seeing a therapist and was started on Lexapro 5 mg at our [facility]. He states that he has been doing very well with this and 85% of his anxiety symptoms have improved. He did have some fatigue/dizziness when he first started the medication but this has resolved.  -He had been experiencing diaphoresis, palpitations, lightheadedness, chest pains but these have improved since being the medication. He does have occasional numbness which has lessened in severity -Has tried the Hydroxyzine twice and did well  -He had been treated with multiple SSRI's and SNRIs in the past for depression.        07/07/2022    3:26 PM 05/26/2022    2:45 PM  Depression screen PHQ 2/9  Decreased Interest 0 1  Down, Depressed, Hopeless 0 0  PHQ - 2 Score 0 1  Altered sleeping 1 2  Tired, decreased energy 1 1  Change in appetite 0 0  Feeling bad or failure about yourself  0 0  Trouble concentrating 0 2  Moving slowly or fidgety/restless 1 1  Suicidal thoughts 0 0  PHQ-9 Score 3 7  Difficult doing work/chores Not difficult at all Not difficult at all   -Current Treatments: Started on Lexapro 5 mg and hydroxyzine 10 mg as needed at last office visit -Patient is compliant with the above medications at above dose and  reports no side effects. Had some dizziness and fatigue  -Counseling: Yes  Nasal Congestion: -Has a bad sinus infection 2 months ago and his symptoms mainly cleared with the exception of nasal congestion, worse on the right side. Denies ear pain, pressure, sinus pain, pressure or sore throat. Does have some clear rhinorrhea occasionally.   Health Maintenance: -Blood work up to date  Outpatient Encounter Medications as of 07/07/2022  Medication Sig   bictegravir-emtricitabine-tenofovir AF (BIKTARVY) 50-200-25 MG TABS tablet Take 1 tablet by mouth daily.   Cholecalciferol (D3 5000) 125 MCG (5000 UT) capsule    escitalopram (LEXAPRO) 5 MG tablet Take 1 tablet (5 mg total) by mouth daily.   hydrOXYzine (ATARAX) 10 MG tablet Take 1 tablet (10 mg total) by mouth 3 (three) times daily as needed.   No facility-administered encounter medications on file as of 07/07/2022.    Past Medical History:  Diagnosis Date   Anxiety    Depression    GERD (gastroesophageal reflux disease)    HIV infection (HCC)     Past Surgical History:  Procedure Laterality Date   EXTERNAL EAR SURGERY     ears pinned back   VASECTOMY      Family History  Problem Relation Age of Onset   Anxiety disorder Mother    Cancer Mother    Vision loss  Mother    Cancer Father    Vision loss Father    Alcohol abuse Maternal Grandfather    Diabetes Maternal Grandmother    Obesity Maternal Grandmother    Stroke Maternal Grandmother    Depression Paternal Grandmother     Social History   Socioeconomic History   Marital status: Married    Spouse name: Not on file   Number of children: Not on file   Years of education: Not on file   Highest education level: Not on file  Occupational History   Not on file  Tobacco Use   Smoking status: Never   Smokeless tobacco: Never  Vaping Use   Vaping Use: Never used  Substance and Sexual Activity   Alcohol use: Yes    Alcohol/week: 4.0 standard drinks of alcohol     Types: 4 Standard drinks or equivalent per week    Comment: occ   Drug use: Yes    Frequency: 1.0 times per week    Types: Psilocybin, Other-see comments    Comment: CBD   Sexual activity: Yes    Birth control/protection: Condom, I.U.D., Surgical  Other Topics Concern   Not on file  Social History Narrative   Not on file   Social Determinants of Health   Financial Resource Strain: Not on file  Food Insecurity: Not on file  Transportation Needs: Not on file  Physical Activity: Not on file  Stress: Not on file  Social Connections: Not on file  Intimate Partner Violence: Not on file    Review of Systems  Constitutional:  Negative for chills and fever.  HENT:  Positive for congestion. Negative for ear pain, sinus pain and sore throat.   Respiratory:  Negative for cough and shortness of breath.   Cardiovascular:  Negative for chest pain.        Objective    BP 122/64   Pulse 88   Temp 98.2 F (36.8 C)   Resp 16   Ht 6\' 2"  (1.88 m)   Wt 203 lb (92.1 kg)   SpO2 98%   BMI 26.06 kg/m   Physical Exam Constitutional:      Appearance: Normal appearance.  HENT:     Head: Normocephalic and atraumatic.     Right Ear: Ear canal and external ear normal.     Left Ear: Ear canal and external ear normal.     Ears:     Comments: Eustachian tube dysfunction bilaterally     Mouth/Throat:     Mouth: Mucous membranes are moist.     Pharynx: Oropharynx is clear.  Eyes:     Conjunctiva/sclera: Conjunctivae normal.  Cardiovascular:     Rate and Rhythm: Normal rate and regular rhythm.  Pulmonary:     Effort: Pulmonary effort is normal.     Breath sounds: Normal breath sounds.  Skin:    General: Skin is warm and dry.  Neurological:     General: No focal deficit present.     Mental Status: He is alert. Mental status is at baseline.  Psychiatric:        Mood and Affect: Mood normal.        Behavior: Behavior normal.         Assessment & Plan:   1. Generalized anxiety  disorder: Doing much better on Lexapro. Plan to increase to 10 mg daily, sent to pharmacy. Continue Hydroxyzine PRN and following with therapist. Follow up here in 3 months for recheck.  - escitalopram (LEXAPRO) 10  MG tablet; Take 1 tablet (10 mg total) by mouth daily.  Dispense: 90 tablet; Refill: 0  2. Congestion of nasal sinus: Sample of Ryaltris given to use 2 sprays on each side twice a day.    Return in about 3 months (around 10/07/2022).   Margarita Mail, DO

## 2022-07-11 ENCOUNTER — Other Ambulatory Visit: Payer: Self-pay | Admitting: Internal Medicine

## 2022-07-11 DIAGNOSIS — F411 Generalized anxiety disorder: Secondary | ICD-10-CM

## 2022-07-12 NOTE — Telephone Encounter (Signed)
Refused refill request for Lexapro 5 mg.  Dose was increased to 10 mg on 07/07/2022 so 5 mg was discontinued.

## 2022-08-02 DIAGNOSIS — B20 Human immunodeficiency virus [HIV] disease: Principal | ICD-10-CM

## 2022-08-02 MED ORDER — BIKTARVY 50 MG-200 MG-25 MG TABLET
ORAL_TABLET | Freq: Every day | ORAL | 4 refills | 30 days
Start: 2022-08-02 — End: ?

## 2022-08-03 NOTE — Unmapped (Signed)
Medication Requested: biktarvy      No future appointments.  Per Provider Note: He has a current medication list which includes the following prescription(s): biktarvy,     Standing order protocol requirements met?: No    Sent to: Provider for signing    Days Supply Given: TBD by provider  Number of Refills: TBD by provider

## 2022-08-03 NOTE — Unmapped (Signed)
[facility] Refill Coordination Note    Specialty Medication(s) to be Shipped:   Infectious Disease: Biktarvy    Other medication(s) to be shipped: No additional medications requested for fill at this time     Austin Thompson, DOB: June 28, 1991  Phone: [phone] (home)       All above HIPAA information was verified with patient.     Was a Nurse, learning disability used for this call? No    Completed refill call assessment today to schedule patient's medication shipment from the [facility] [phone]).  All relevant notes have been reviewed.     Specialty medication(s) and dose(s) confirmed: Regimen is correct and unchanged.   Changes to medications: Allec reports no changes at this time.  Changes to insurance: No  New side effects reported not previously addressed with a pharmacist or physician: None reported  Questions for the pharmacist: No    Confirmed patient received a Conservation officer, historic buildings and a Surveyor, mining with first shipment. The patient will receive a drug information handout for each medication shipped and additional FDA Medication Guides as required.       DISEASE/MEDICATION-SPECIFIC INFORMATION        N/A    SPECIALTY MEDICATION ADHERENCE     Medication Adherence    Patient reported X missed doses in the last month: 0  Specialty Medication: Biktarvy  Patient is on additional specialty medications: No  Patient is on more than two specialty medications: No  Any gaps in refill history greater than 2 weeks in the last 3 months: no  Demonstrates understanding of importance of adherence: yes                          Were doses missed due to medication being on hold? No    Biktarvy 50-200-25 mg: 7-10 days of medicine on hand        REFERRAL TO PHARMACIST     Referral to the pharmacist: Not needed      [facility]     Shipping address confirmed in Epic.     Delivery Scheduled: Yes, Expected medication delivery date: 08/08/22 .     Medication will be delivered via Next Day Courier to the prescription address in Epic WAM.    Ricci Barker   [facility] Specialty Technician

## 2022-08-04 MED ORDER — BIKTARVY 50 MG-200 MG-25 MG TABLET
ORAL_TABLET | Freq: Every day | ORAL | 4 refills | 30 days | Status: CP
Start: 2022-08-04 — End: ?
  Filled 2022-08-07: qty 30, 30d supply, fill #0

## 2022-09-01 NOTE — Unmapped (Signed)
[facility] Refill Coordination Note    Specialty Medication(s) to be Shipped:   Infectious Disease: Biktarvy    Other medication(s) to be shipped: No additional medications requested for fill at this time     Austin Thompson, DOB: 12-27-1990  Phone: [phone] (home)       All above HIPAA information was verified with patient.     Was a Nurse, learning disability used for this call? No    Completed refill call assessment today to schedule patient's medication shipment from the [facility] [phone]).  All relevant notes have been reviewed.     Specialty medication(s) and dose(s) confirmed: Regimen is correct and unchanged.   Changes to medications: Troy reports no changes at this time.  Changes to insurance: No  New side effects reported not previously addressed with a pharmacist or physician: None reported  Questions for the pharmacist: No    Confirmed patient received a Conservation officer, historic buildings and a Surveyor, mining with first shipment. The patient will receive a drug information handout for each medication shipped and additional FDA Medication Guides as required.       DISEASE/MEDICATION-SPECIFIC INFORMATION        N/A    SPECIALTY MEDICATION ADHERENCE     Medication Adherence    Patient reported X missed doses in the last month: 0  Specialty Medication: biktarvy 50-200-25mg                           Were doses missed due to medication being on hold? No    biktarvy 50-100-25mg   : 10 days of medicine on hand     REFERRAL TO PHARMACIST     Referral to the pharmacist: Not needed      [facility]     Shipping address confirmed in Epic.     Delivery Scheduled: Yes, Expected medication delivery date: 9/12.     Medication will be delivered via Next Day Courier to the prescription address in Epic WAM.    [name]   [facility] Specialty Technician

## 2022-09-04 MED FILL — BIKTARVY 50 MG-200 MG-25 MG TABLET: ORAL | 30 days supply | Qty: 30 | Fill #1

## 2022-10-02 ENCOUNTER — Other Ambulatory Visit: Payer: Self-pay | Admitting: Internal Medicine

## 2022-10-02 DIAGNOSIS — F411 Generalized anxiety disorder: Secondary | ICD-10-CM

## 2022-10-03 NOTE — Telephone Encounter (Signed)
Requested Prescriptions  Pending Prescriptions Disp Refills  . escitalopram (LEXAPRO) 10 MG tablet [Pharmacy Med Name: ESCITALOPRAM 10MG  TABLETS] 90 tablet 1    Sig: TAKE 1 TABLET(10 MG) BY MOUTH DAILY     Psychiatry:  Antidepressants - SSRI Passed - 10/02/2022  3:10 AM      Passed - Valid encounter within last 6 months    Recent Outpatient Visits          2 months ago Generalized anxiety disorder   [facility], DO   4 months ago Generalized anxiety disorder   [facility] Teodora Medici, DO      Future Appointments            In 6 days Teodora Medici, [facility], [facility]

## 2022-10-05 NOTE — Unmapped (Signed)
[facility] Refill Coordination Note    Specialty Medication(s) to be Shipped:   Infectious Disease: Biktarvy    Other medication(s) to be shipped: No additional medications requested for fill at this time     Austin Thompson, DOB: 1991/03/18  Phone: [phone] (home)       All above HIPAA information was verified with patient.     Was a Nurse, learning disability used for this call? No    Completed refill call assessment today to schedule patient's medication shipment from the [facility] [phone]).  All relevant notes have been reviewed.     Specialty medication(s) and dose(s) confirmed: Regimen is correct and unchanged.   Changes to medications: Austin Thompson reports no changes at this time.  Changes to insurance: No  New side effects reported not previously addressed with a pharmacist or physician: None reported  Questions for the pharmacist: No    Confirmed patient received a Conservation officer, historic buildings and a Surveyor, mining with first shipment. The patient will receive a drug information handout for each medication shipped and additional FDA Medication Guides as required.       DISEASE/MEDICATION-SPECIFIC INFORMATION        N/A    SPECIALTY MEDICATION ADHERENCE     Medication Adherence    Patient reported X missed doses in the last month: 0  Specialty Medication: BIKTARVY 50-200-25 mg                          Were doses missed due to medication being on hold? No    biktarvy 50-200-25mg   : 7 days of medicine on hand       REFERRAL TO PHARMACIST     Referral to the pharmacist: Not needed      [facility]     Shipping address confirmed in Epic.     Delivery Scheduled: Yes, Expected medication delivery date: 10/17.     Medication will be delivered via Next Day Courier to the prescription address in Epic WAM.    [name]   [facility] Specialty Technician

## 2022-10-09 ENCOUNTER — Encounter: Payer: Self-pay | Admitting: Internal Medicine

## 2022-10-09 ENCOUNTER — Ambulatory Visit: Payer: BC Managed Care – PPO | Admitting: Internal Medicine

## 2022-10-09 VITALS — BP 122/70 | HR 80 | Temp 98.2°F | Resp 16 | Ht 74.0 in | Wt 200.7 lb

## 2022-10-09 DIAGNOSIS — F411 Generalized anxiety disorder: Secondary | ICD-10-CM | POA: Diagnosis not present

## 2022-10-09 MED ORDER — ESCITALOPRAM OXALATE 5 MG PO TABS: 5.0000 mg | ORAL_TABLET | Freq: Every day | ORAL | 1 refills | Status: DC

## 2022-10-09 MED FILL — BIKTARVY 50 MG-200 MG-25 MG TABLET: ORAL | 30 days supply | Qty: 30 | Fill #2

## 2022-10-09 NOTE — Progress Notes (Signed)
Established Patient Office Visit  Subjective    Patient ID: Craig Hicks, male    DOB: 01-09-1991  Age: 31 y.o. MRN: [id number]  CC:  Chief Complaint  Patient presents with   Follow-up   Anxiety    HPI Craig Hicks presents for follow-up.  HIV: -Currently on Biktarvy 50-200-25 mg daily -He follows with ID at [facility]. Last seen in January, note reviewed from 01/04/22.  -Last viral loads were checked in 6/22. First diagnosed in 2021.   Vitamin D Deficiency: -Currently on 5000 IU daily -Vitamin D was last checked several years ago and was low at that time.  Anxiety: -Since he was diagnosed with a bad case of COVID in 2021 and then HIV, he has developed anxiety around his health.   -Currently on Lexapro 10 mg, Hydroxyzine 10 mg PRN - anxiety is much better controlled however he would like to go back down to the 5 mg dose because he is having some  lightheadedness and feeling more apathetic on the higher dose -He is currently seeing a therapist  -Prior to medication he had been experiencing diaphoresis, palpitations, lightheadedness, chest pains but these have improved since being the medication. He does have some occasional constipation  -Hydroxyzine is doing well to control panic attack symptoms  -He had been treated with multiple SSRI's and SNRIs in the past for depression.        10/09/2022    1:04 PM 07/07/2022    3:26 PM 05/26/2022    2:45 PM  Depression screen PHQ 2/9  Decreased Interest 0 0 1  Down, Depressed, Hopeless 0 0 0  PHQ - 2 Score 0 0 1  Altered sleeping 0 1 2  Tired, decreased energy 0 1 1  Change in appetite 0 0 0  Feeling bad or failure about yourself  0 0 0  Trouble concentrating 0 0 2  Moving slowly or fidgety/restless 0 1 1  Suicidal thoughts 0 0 0  PHQ-9 Score 0 3 7  Difficult doing work/chores Not difficult at all Not difficult at all Not difficult at all    Health Maintenance: -Blood work up to date  Outpatient Encounter Medications as of 10/09/2022   Medication Sig   bictegravir-emtricitabine-tenofovir AF (BIKTARVY) 50-200-25 MG TABS tablet Take 1 tablet by mouth daily.   Cholecalciferol (D3 5000) 125 MCG (5000 UT) capsule    escitalopram (LEXAPRO) 10 MG tablet TAKE 1 TABLET(10 MG) BY MOUTH DAILY   hydrOXYzine (ATARAX) 10 MG tablet Take 1 tablet (10 mg total) by mouth 3 (three) times daily as needed.   No facility-administered encounter medications on file as of 10/09/2022.    Past Medical History:  Diagnosis Date   Anxiety    Depression    GERD (gastroesophageal reflux disease)    HIV infection ([place])     Past Surgical History:  Procedure Laterality Date   EXTERNAL EAR SURGERY     ears pinned back   VASECTOMY      Family History  Problem Relation Age of Onset   Anxiety disorder Mother    Cancer Mother    Vision loss Mother    Cancer Father    Vision loss Father    Alcohol abuse Maternal Grandfather    Diabetes Maternal Grandmother    Obesity Maternal Grandmother    Stroke Maternal Grandmother    Depression Paternal Grandmother     Social History   Socioeconomic History   Marital status: Married    Spouse name: Not on  file   Number of children: Not on file   Years of education: Not on file   Highest education level: Not on file  Occupational History   Not on file  Tobacco Use   Smoking status: Never   Smokeless tobacco: Never  Vaping Use   Vaping Use: Never used  Substance and Sexual Activity   Alcohol use: Yes    Alcohol/week: 4.0 standard drinks of alcohol    Types: 4 Standard drinks or equivalent per week    Comment: occ   Drug use: Yes    Frequency: 1.0 times per week    Types: Psilocybin, Other-see comments    Comment: CBD   Sexual activity: Yes    Birth control/protection: Condom, I.U.D., Surgical  Other Topics Concern   Not on file  Social History Narrative   Not on file   Social Determinants of Health   Financial Resource Strain: Not on file  Food Insecurity: Not on file   Transportation Needs: Not on file  Physical Activity: Not on file  Stress: Not on file  Social Connections: Not on file  Intimate Partner Violence: Not on file    Review of Systems  Constitutional:  Negative for chills and fever.  Respiratory:  Negative for cough and shortness of breath.   Cardiovascular:  Negative for chest pain.  Gastrointestinal:  Positive for constipation.        Objective    BP 122/70   Pulse 80   Temp 98.2 F (36.8 C)   Resp 16   Ht 6\' 2"  (1.88 m)   Wt 200 lb 11.2 oz (91 kg)   SpO2 99%   BMI 25.77 kg/m   Physical Exam Constitutional:      Appearance: Normal appearance.  HENT:     Head: Normocephalic and atraumatic.  Eyes:     Conjunctiva/sclera: Conjunctivae normal.  Cardiovascular:     Rate and Rhythm: Normal rate and regular rhythm.  Pulmonary:     Effort: Pulmonary effort is normal.     Breath sounds: Normal breath sounds.  Skin:    General: Skin is warm and dry.  Neurological:     General: No focal deficit present.     Mental Status: He is alert. Mental status is at baseline.  Psychiatric:        Mood and Affect: Mood normal.        Behavior: Behavior normal.       Assessment & Plan:   1. Generalized anxiety disorder: Doing well, decrease to Lexapro 5 mg daily, sent to pharmacy. Follow up in 6 months or sooner as needed.  - escitalopram (LEXAPRO) 5 MG tablet; Take 1 tablet (5 mg total) by mouth daily.  Dispense: 90 tablet; Refill: 1  Return in about 6 months (around 04/10/2023).   04/12/2023, DO

## 2022-10-09 NOTE — Patient Instructions (Signed)
It was great seeing you today!  Plan discussed at today's visit: -Lexapro changed to 5 mg   Follow up in: 6 months   Take care and let us know if you have any questions or concerns prior to your next visit.  Dr. Rosana Berger

## 2022-11-20 ENCOUNTER — Telehealth: Payer: Self-pay | Admitting: Internal Medicine

## 2022-11-20 NOTE — Unmapped (Signed)
[facility] Refill [facility]ination Note    Specialty Medication(s) to be Shipped:   Infectious Disease: Biktarvy    Other medication(s) to be shipped: No additional medications requested for fill at this time     Revere [name], DOB: 1991/03/27  Phone: [phone] (home)       All above HIPAA information was verified with patient.     Was a Nurse, learning disability used for this call? No    Completed refill call assessment today to schedule patient's medication shipment from the [facility] [phone]).  All relevant notes have been reviewed.     Specialty medication(s) and dose(s) confirmed: Regimen is correct and unchanged.   Changes to medications: Austin Thompson reports no changes at this time.  Changes to insurance: No  New side effects reported not previously addressed with a pharmacist or physician: None reported  Questions for the pharmacist: No    Confirmed patient received a Conservation officer, historic buildings and a Surveyor, mining with first shipment. The patient will receive a drug information handout for each medication shipped and additional FDA Medication Guides as required.       DISEASE/MEDICATION-SPECIFIC INFORMATION        N/A    SPECIALTY MEDICATION ADHERENCE     Medication Adherence    Patient reported X missed doses in the last month: 0  Specialty Medication: biktarvy 50-200-25mg   Patient is on additional specialty medications: No  Patient is on more than two specialty medications: No  Any gaps in refill history greater than 2 weeks in the last 3 months: no  Demonstrates understanding of importance of adherence: yes  Informant: patient  Reliability of informant: reliable  Provider-estimated medication adherence level: good  Patient is at risk for Non-Adherence: No  Reasons for non-adherence: no problems identified                                Were doses missed due to medication being on hold? No    BIKTARVY 50-200-25 mg tablet (bictegrav-emtricit-tenofov ala)   : 2 days of medicine on hand REFERRAL TO PHARMACIST     Referral to the pharmacist: Not needed      [facility]     Shipping address confirmed in Epic.     Delivery Scheduled: Yes, Expected medication delivery date: 11/22/22.     Medication will be delivered via Same Day Courier to the prescription address in Epic WAM.    Austin Thompson Shared [facility] Specialty Technician

## 2022-11-20 NOTE — Telephone Encounter (Signed)
Copied from CRM [id number]. Topic: General - Other >> Nov 20, 2022  9:45 AM [name] wrote: Pt states he has an ingrown hair in the groin area x63m   Pt inquiring if provider can treat the area or if he need to see a specialist  Please fu w/ pt

## 2022-11-20 NOTE — Telephone Encounter (Signed)
Appt sch'd for 11.29

## 2022-11-20 NOTE — Telephone Encounter (Signed)
We will need to see?  Please schedule an appt

## 2022-11-21 NOTE — Progress Notes (Unsigned)
   Acute Office Visit  Subjective:     Patient ID: Craig Hicks, male    DOB: 02-Sep-1991, 31 y.o.   MRN: [id number]  No chief complaint on file.   HPI Patient is in today for ingrown hair in the groin area.     ROS      Objective:    There were no vitals taken for this visit. {Vitals History (Optional):23777}  Physical Exam  No results found for any visits on 11/22/22.      Assessment & Plan:   Problem List Items Addressed This Visit   None   No orders of the defined types were placed in this encounter.   No follow-ups on file.  Margarita Mail, DO

## 2022-11-22 ENCOUNTER — Encounter: Payer: Self-pay | Admitting: Internal Medicine

## 2022-11-22 ENCOUNTER — Ambulatory Visit: Payer: BC Managed Care – PPO | Admitting: Internal Medicine

## 2022-11-22 VITALS — BP 126/68 | HR 89 | Temp 98.9°F | Resp 18 | Ht 74.0 in | Wt 205.5 lb

## 2022-11-22 DIAGNOSIS — L0291 Cutaneous abscess, unspecified: Secondary | ICD-10-CM

## 2022-11-22 MED ORDER — DOXYCYCLINE HYCLATE 100 MG PO TABS: 100.0000 mg | ORAL_TABLET | Freq: Two times a day (BID) | ORAL | 0 refills | Status: AC

## 2022-11-22 MED FILL — BIKTARVY 50 MG-200 MG-25 MG TABLET: ORAL | 30 days supply | Qty: 30 | Fill #3

## 2022-11-22 NOTE — Patient Instructions (Signed)
It was great seeing you today!  Plan discussed at today's visit: -Take antibiotics as directed, can also use topical antibiotics -Use warm compresses to help lesion drain -Please let me know if you develop pain, warmth or fevers  Follow up in: as needed  Take care and let us know if you have any questions or concerns prior to your next visit.  Dr. Caralee Ates

## 2022-12-15 NOTE — Unmapped (Signed)
[facility] Refill Coordination Note    Specialty Medication(s) to be Shipped:   Infectious Disease: Biktarvy    Other medication(s) to be shipped: No additional medications requested for fill at this time     Austin Thompson, DOB: 04-May-1991  Phone: [phone] (home)       All above HIPAA information was verified with patient.     Was a Nurse, learning disability used for this call? No    Completed refill call assessment today to schedule patient's medication shipment from the [facility] [phone]).  All relevant notes have been reviewed.     Specialty medication(s) and dose(s) confirmed: Regimen is correct and unchanged.   Changes to medications: Austin Thompson reports no changes at this time.  Changes to insurance: No  New side effects reported not previously addressed with a pharmacist or physician: None reported  Questions for the pharmacist: No    Confirmed patient received a Conservation officer, historic buildings and a Surveyor, mining with first shipment. The patient will receive a drug information handout for each medication shipped and additional FDA Medication Guides as required.       DISEASE/MEDICATION-SPECIFIC INFORMATION        N/A    SPECIALTY MEDICATION ADHERENCE     Medication Adherence    Patient reported X missed doses in the last month: 0  Specialty Medication: BIKTARVY 50-200-25 mg tablet (bictegrav-emtricit-tenofov ala)  Patient is on additional specialty medications: No  Patient is on more than two specialty medications: No  Any gaps in refill history greater than 2 weeks in the last 3 months: no  Demonstrates understanding of importance of adherence: yes  Informant: patient  Reliability of informant: reliable  Provider-estimated medication adherence level: good  Patient is at risk for Non-Adherence: No  Reasons for non-adherence: no problems identified                                Were doses missed due to medication being on hold? No    BIKTARVY 50-200-25 mg tablet (bictegrav-emtricit-tenofov ala)  : 6 days of medicine on hand        REFERRAL TO PHARMACIST     Referral to the pharmacist: Not needed      [facility]     Shipping address confirmed in Epic.     Delivery Scheduled: Yes, Expected medication delivery date: 12/19/22.     Medication will be delivered via Same Day Courier to the prescription address in Epic WAM.    Weltha Cathy' W Wilhemena Durie Shared [facility] Specialty Technician

## 2022-12-19 MED FILL — BIKTARVY 50 MG-200 MG-25 MG TABLET: ORAL | 30 days supply | Qty: 30 | Fill #4

## 2022-12-27 NOTE — Unmapped (Signed)
Linkage and Hydrologist spoke confidentially with patient to schedule appointment for retention. Appointment is set for 01/03/23.    Barriers to Care/Medication Adherence:   Are you experiencing any barriers to care like: transportation to medical appointments, stable housing, lack of food, etc.? N/A  Do you have enough medication to last you till your upcoming appointment? N/A    Referral Outcome: N/A    [facility] Care Plan: Out of Care Patient (>6 months)    Patient's last ID clinic appt: 01/04/22    Goal: Patient will have an office visit within 6 months of the last office visit unless stated otherwise by their provider.     Contact Interventions may include:  Placing phone calls to the patient and/or listed contacts.  Sending the patient an Out of Care text message through the [employer] or [facility] cell phone (if texting agreement is signed).  Sending the patient an Out of Care MyChart/letter.  Contacting the patient's most recent pharmacies as needed for additional information such as contact information or refill history.  Contacting the patient's case manager/caregiver as needed for additional information, if assigned.  Researching other online resources as needed.     If the patient is reached, the [employer] will attempt to:  Update all contact information including primary and secondary phone numbers and emergency contact details and address. Confirm access to MyChart.  Complete the Out of Care Intervention questionnaire with the patient.  Explore and discuss any barriers to visit attendance the patient may be experiencing that result in missed appointments, no-shows, cancellations, or lack of a scheduled follow-up appointment. These may include communication barriers (language, phone access, texting, MyChart), transportation, financial concerns, time off work, mental health/substance use, and/or multiple medical appointments.  Make appropriate referrals and link the patient to clinic nurses, social workers, benefits counselors, or other support services in the [facility] ID clinic to address concerns or barriers to care.  Schedule a follow-up appointment at an appropriate interval based on a completed assessment.  Confirm the patient has access to ART medications until that appointment. If the patient is not currently taking ART, needs refills, or is having difficulty taking the medication, refer to the clinical staff for follow up via [employer].    Monitor attendance of the patient's scheduled appointment and provide reminder calls/texts/messages consistent with patient preference, if needed.  Continue follow-up if indicated.    Expected Outcome:  Patient will have an office visit within 6 months of the last office visit unless stated otherwise by their provider.    If the patient has an urgent medical problem send a chat message to the clinic nursing staff for immediate follow-up     **If the patient is not able to be located or retained in HIV care, a referral will be placed to the [facility] HIV [facility] Counselor for further follow-up.    Duration of Service: 15 minutes    Austin Thompson  Linkage and Horticulturist, commercial Infectious Diseases Clinic

## 2023-01-03 ENCOUNTER — Ambulatory Visit: Admit: 2023-01-03 | Discharge: 2023-01-04 | Payer: PRIVATE HEALTH INSURANCE

## 2023-01-03 DIAGNOSIS — F419 Anxiety disorder, unspecified: Secondary | ICD-10-CM | POA: Diagnosis not present

## 2023-01-03 DIAGNOSIS — Z5181 Encounter for therapeutic drug level monitoring: Secondary | ICD-10-CM | POA: Diagnosis not present

## 2023-01-03 DIAGNOSIS — G43109 Migraine with aura, not intractable, without status migrainosus: Secondary | ICD-10-CM | POA: Diagnosis not present

## 2023-01-03 DIAGNOSIS — Z79899 Other long term (current) drug therapy: Secondary | ICD-10-CM | POA: Diagnosis not present

## 2023-01-03 DIAGNOSIS — Z9189 Other specified personal risk factors, not elsewhere classified: Secondary | ICD-10-CM | POA: Diagnosis not present

## 2023-01-03 DIAGNOSIS — B20 Human immunodeficiency virus [HIV] disease: Secondary | ICD-10-CM | POA: Diagnosis not present

## 2023-01-03 DIAGNOSIS — F32A Anxiety and depression: Principal | ICD-10-CM

## 2023-01-03 MED ORDER — BIKTARVY 50 MG-200 MG-25 MG TABLET
ORAL_TABLET | Freq: Every day | ORAL | 5 refills | 30 days | Status: CP
Start: 2023-01-03 — End: ?
  Filled 2023-01-16: qty 30, 30d supply, fill #0

## 2023-01-03 NOTE — Unmapped (Signed)
[facility]  [address]  [place], [place]  [address]  P [phone]  F [phone]     Primary care provider: Domenic Schwab, FNP    Assessment/Plan:      HIV (dx'd 2021, nadir CD4 540 / 36% in 2021)    Overall doing well.  Current regimen: Biktarvy (BIC/FTC/TAF)  Misses doses of ARVs rarely    Med access through insurance  CD4 count over 300 for >2Y on suppressive ART; no prophylaxis needed; recheck CD4 annually  Discussed ARV adherence    Lab Results   Component Value Date    ACD4 936 01/04/2022    CD4 39 01/04/2022    HIVRS Not Detected 01/04/2022    HIVCP 48 (H) 12/14/2020     ??? CD4, HIV RNA, and safety labs  ? He will get labs through [facility] today.   ??? Continue current therapy of Biktarvy.   ??? Encouraged continued excellent ARV adherence    Anxiety and depression  - History of depression in college which worsened during HIV diagnosis. He's had an extensive work-up for various symptoms that have all ultimately improved and were likely related to underlying mood d/o including MRI brain, CT neck, and ziopatch.   - Restarted Lexapro 5mg  (was on 10mg  but had some SE). Feels this dose is working for him.    Costochondritis pain  Reports pain will come at any time, sometimes daily. No triggers - comes at rest and sometimes when exerting self. No clear resolving factors - sometimes will take 200mg  ibuprofen which dulls it. No history of prior similar pain before HIV diagnosis.   - He notes today it's improved since starting Lexapro, but still occurs occasionally. Will often be in lower part of rib cage bilaterally and be tender to palpation. Inflammatory markers negative at last visit.   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     Sexual health & secondary prevention  Sex with cis women.  Having sex only with primary partner.   Since last visit, vaginal sex and has not had add'l STI screening.   He does routinely discuss status with partner(s). Never uses condoms.  ??  Not interested in having children. Wife not interested    Lab Results   Component Value Date    RPR Nonreactive 12/14/2020    RPR Nonreactive 06/22/2020       ??? GC/CT NAATs - not being checked routinely for this patient  ??? RPR - not being checked routinely for this patient    Health maintenance     Metabolic conditions  Wt Readings from Last 5 Encounters:   01/03/23 94.3 kg (207 lb 12.8 oz)   01/04/22 (!) 108 kg (238 lb)   05/25/21 (!) 101.2 kg (223 lb)   10/20/20 89.9 kg (198 lb 4.8 oz)   09/14/20 82.7 kg (182 lb 6.4 oz)     Lab Results   Component Value Date    CREATININE 0.99 01/04/2022    GLU 99 01/04/2022    A1C 4.8 05/25/2021    ALT 32 01/04/2022    ALT 18 05/25/2021    ALT 16 12/14/2020     # Kidney health - creatinine today  # Bone health - assessment not yet needed (under age 23)  # Diabetes assessment - random glucose today  # NAFLD  assessment - suspicion for NAFLD low    Communicable diseases  Lab Results   Component Value Date    QFTTBGOLD Negative 01/04/2022    HEPAIGG Nonreactive 06/22/2020    HEPBSAB Reactive (A) 06/22/2020    HEPBCAB Nonreactive 06/22/2020    HEPCAB Nonreactive 06/22/2020     # TB screening - no longer needed; negative IGRA, low risk  # Hepatitis screening - no indication for screening      Immunization History   Administered Date(s) Administered   ??? COVID-19 VAC,BIVALENT(29YR UP),PFIZER 01/07/2022   ??? COVID-19 VACC,MRNA,(PFIZER)(PF) 03/09/2020, 03/30/2020, 10/15/2020   ??? Covid-19 Vac, (42yr+) (Comirnaty) Mrna Pfizer  10/15/2022   ??? DTaP 07/26/1998   ??? DTaP, Unspecified Formulation 09/19/1993, 10/21/1993, 08/18/1994   ??? Hepatitis B Vaccine, Unspecified Formulation 10/21/1993, 01/23/1994, 05/04/1994   ??? HiB, unspecified 08/30/1993, 10/21/1993, 02/22/1994, 08/18/1994   ??? Influenza Virus Vaccine, unspecified formulation 10/13/2021, 10/15/2022   ??? MMR 08/18/1994, 07/26/1998   ??? Meningococcal Conjugate MCV4P 01/19/2011   ??? OPV 07/26/1998   ??? Pneumococcal Conjugate 13-Valent 05/25/2021   ??? Polio Virus Vaccine, Unspecified Formulation 08/30/1993, 10/21/1993, 01/23/1994   ??? Td (adult) unspecified formulation 04/11/2006       ??? Immunizations needed - Discussed recommendation for TDaP and Shingrix. He thinks he got Tdap within last 5 years. He would like to defer Shingrix- would like to get it on a Friday before a weekend to be able to manage possible SE at home.     I personally spent 42 minutes face-to-face and non-face-to-face in the care of this patient, which includes all pre, intra, and post visit time on the date of service.      Disposition  Next appointment: 5-6 months    Lahoma Rocker, PA-C  [facility]   [address]  [place], [place].  [address]  Phone: [phone]   Fax: [phone]      Subjective        HPI  In addition to details in A&P above:    - He is doing well overall. He takes Radio producer daily around lunch. He doesn't take any other vitamins/supplements/herbs regularly. He sometimes takes famotidine for GERD, but symptoms have been mild and intermittent recently.     He is taking Lexapro 5mg - feels this is working well. He's been on this for around 3 months-prior was on 10mg  but had some side effects.     He notes occasional sternal and lower anterior rib pain- usually tender to touch. This may have gotten a bit better since being on Lexapro.    No recent fever, chills, rashes, nausea, vomiting, abdominal pain, cough, dyspnea.       No past medical history on file.    Social History    Housing - in house in [place] with wife, also has pets (dog, fish, snake). Mom and dad live in [place], extended family live in [place].   School / Work - Not in school. Employed (collects specimens for laboratory, hiking in woods/streams etc.)     Tobacco - He reports that he has never smoked. He has never used smokeless tobacco.   Alcohol - special occassions     Substance use - none    Medications and Allergies  He has a current medication list which includes the following prescription(s): escitalopram oxalate, biktarvy, and fexofenadine.    Allergies: Patient has no known allergies.      Family History  His family history is not on file.  Objective      BP 97/58 (BP Site: L Arm, BP Position: Sitting, BP Cuff Size: Large)  - Pulse 68  - Temp 36.6 ??C (97.8 ??F) (Tympanic)  - Ht 188 cm (6' 2)  - Wt 94.3 kg (207 lb 12.8 oz)  - BMI 26.68 kg/m??      Physical Exam  Vitals reviewed.   Constitutional:       Appearance: Normal appearance.   HENT:      Mouth/Throat:      Mouth: Mucous membranes are moist.      Pharynx: Oropharynx is clear.   Cardiovascular:      Rate and Rhythm: Normal rate and regular rhythm.   Pulmonary:      Effort: Pulmonary effort is normal.      Breath sounds: Normal breath sounds.   Lymphadenopathy:      Head:      Right side of head: No submental, submandibular, tonsillar, preauricular, posterior auricular or occipital adenopathy.      Left side of head: No submental, submandibular, tonsillar, preauricular, posterior auricular or occipital adenopathy.      Cervical: No cervical adenopathy.      Upper Body:      Right upper body: No supraclavicular adenopathy.      Left upper body: No supraclavicular adenopathy.   Skin:     General: Skin is warm and dry.   Neurological:      General: No focal deficit present.      Mental Status: He is alert.   Psychiatric:         Mood and Affect: Mood normal.         Behavior: Behavior normal.         Thought Content: Thought content normal.         Judgment: Judgment normal.

## 2023-01-03 NOTE — Unmapped (Signed)
Started assessment with patient options: in clinic      Patient declined RW services at this time.     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      Madinah Cathcart-Rowe  Benefits Counselor  Time of Intervention: 8 mins

## 2023-01-09 LAB — LYMPHOCYTE MARKERS LIMITED
% CD 3 POS. LYMPH.: 68.2 % (ref 57.5–86.2)
% NK (CD56/16): 19.2 % (ref 1.4–19.4)
AB NK (CD56): 346 /uL (ref 24–406)
ABSOLUTE CD 3: 1228 /uL (ref 622–2402)
ABSOLUTE CD8 CNT: 477 /uL (ref 109–897)
BANDED NEUTROPHILS ABSOLUTE COUNT: 0 10*3/uL (ref 0.0–0.1)
BASOPHILS ABSOLUTE COUNT: 0.1 10*3/uL (ref 0.0–0.2)
BASOPHILS RELATIVE PERCENT: 1 %
CD4 % HELPER T CELL: 41.4 % (ref 30.8–58.5)
CD4 T CELL ABSOLUTE: 745 /uL (ref 359–1519)
CD4:CD8 RATIO: 1.56 (ref 0.92–3.72)
CD8 % SUPPRESSOR T CELL: 26.5 % (ref 12.0–35.5)
EOSINOPHILS ABSOLUTE COUNT: 0.2 10*3/uL (ref 0.0–0.4)
EOSINOPHILS RELATIVE PERCENT: 3 %
HEMATOCRIT: 44 % (ref 37.5–51.0)
HEMOGLOBIN: 15.2 g/dL (ref 13.0–17.7)
IMMATURE GRANULOCYTES: 0 %
LYMPHOCYTES ABSOLUTE COUNT: 1.8 10*3/uL (ref 0.7–3.1)
LYMPHOCYTES RELATIVE PERCENT: 39 %
MEAN CORPUSCULAR HEMOGLOBIN CONC: 34.5 g/dL (ref 31.5–35.7)
MEAN CORPUSCULAR HEMOGLOBIN: 31.1 pg (ref 26.6–33.0)
MEAN CORPUSCULAR VOLUME: 90 fL (ref 79–97)
MONOCYTES ABSOLUTE COUNT: 0.4 10*3/uL (ref 0.1–0.9)
MONOCYTES RELATIVE PERCENT: 10 %
NEUTROPHILS ABSOLUTE COUNT: 2.1 10*3/uL (ref 1.4–7.0)
NEUTROPHILS RELATIVE PERCENT: 47 %
PLATELET COUNT: 241 10*3/uL (ref 150–450)
RED BLOOD CELL COUNT: 4.88 x10E6/uL (ref 4.14–5.80)
RED CELL DISTRIBUTION WIDTH: 12.5 % (ref 11.6–15.4)
WHITE BLOOD CELL COUNT: 4.5 10*3/uL (ref 3.4–10.8)

## 2023-01-09 LAB — HIV RNA, QUANTITATIVE, PCR: HIV RNA: <40 {copies}/mL

## 2023-01-09 LAB — BASIC METABOLIC PANEL
BLOOD UREA NITROGEN: 11 mg/dL (ref 6–20)
BUN / CREAT RATIO: 11 (ref 9–20)
CALCIUM: 9.7 mg/dL (ref 8.7–10.2)
CHLORIDE: 103 mmol/L (ref 96–106)
CO2: 23 mmol/L (ref 20–29)
CREATININE: 1.01 mg/dL (ref 0.76–1.27)
EGFR: 103 mL/min/{1.73_m2}
GLUCOSE: 97 mg/dL (ref 70–99)
POTASSIUM: 4.7 mmol/L (ref 3.5–5.2)
SODIUM: 141 mmol/L (ref 134–144)

## 2023-01-09 LAB — BILIRUBIN, TOTAL: BILIRUBIN TOTAL (MG/DL) IN SER/PLAS: 0.5 mg/dL (ref 0.0–1.2)

## 2023-01-09 LAB — ALT: ALT (SGPT): 13 IU/L (ref 0–44)

## 2023-01-09 LAB — AST: AST (SGOT): 19 IU/L (ref 0–40)

## 2023-01-14 NOTE — Unmapped (Signed)
[facility] Refill Coordination Note    Austin Thompson, DOB: 10/21/1991  Phone: [phone] (home)       All above HIPAA information was verified with patient.         01/12/2023    11:34 AM   Specialty Rx Medication Refill Questionnaire   Which Medications would you like refilled and shipped? Biktarvy 12 days   Please list all current allergies: None   Have you missed any doses in the last 30 days? No   Have you had any changes to your medication(s) since your last refill? No   How many days remaining of each medication do you have at home? 12   Have you experienced any side effects in the last 30 days? No   Please enter the full address (street address, city, state, zip code) where you would like your medication(s) to be delivered to. [address], [place], [place] [address]   Please specify on which day you would like your medication(s) to arrive. Note: if you need your medication(s) within 3 days, please call the pharmacy to schedule your order at [phone]  01/16/2023   Has your insurance changed since your last refill? No   Would you like a pharmacist to call you to discuss your medication(s)? No   Do you require a signature for your package? (Note: if we are billing Medicare Part B or your order contains a controlled substance, we will require a signature) No         Completed refill call assessment today to schedule patient's medication shipment from the [facility] [phone]).  All relevant notes have been reviewed.       Confirmed patient received a Conservation officer, historic buildings and a Surveyor, mining with first shipment. The patient will receive a drug information handout for each medication shipped and additional FDA Medication Guides as required.         REFERRAL TO PHARMACIST     Referral to the pharmacist: Not needed      [facility]     Shipping address confirmed in Epic.     Delivery Scheduled: Yes, Expected medication delivery date: 01/16/23.     Medication will be delivered via Same Day Courier to the prescription address in Epic WAM.    Tobi Bastos, PharmD   [facility] Specialty Pharmacist

## 2023-01-17 NOTE — Unmapped (Signed)
error 

## 2023-02-14 NOTE — Unmapped (Signed)
[facility] Refill Coordination Note    Specialty Medication(s) to be Shipped:   Infectious Disease: Biktarvy    Other medication(s) to be shipped: No additional medications requested for fill at this time     Austin Thompson, DOB: Feb 17, 1991  Phone: [phone] (home)       All above HIPAA information was verified with patient.     Was a Nurse, learning disability used for this call? No    Completed refill call assessment today to schedule patient's medication shipment from the [facility] [phone]).  All relevant notes have been reviewed.     Specialty medication(s) and dose(s) confirmed: Regimen is correct and unchanged.   Changes to medications: Kyshaun reports no changes at this time.  Changes to insurance: No  New side effects reported not previously addressed with a pharmacist or physician: None reported  Questions for the pharmacist: No    Confirmed patient received a Conservation officer, historic buildings and a Surveyor, mining with first shipment. The patient will receive a drug information handout for each medication shipped and additional FDA Medication Guides as required.       DISEASE/MEDICATION-SPECIFIC INFORMATION        N/A    SPECIALTY MEDICATION ADHERENCE     Medication Adherence    Patient reported X missed doses in the last month: 0  Specialty Medication: BIKTARVY 50-200-25 mg tablet (bictegrav-emtricit-tenofov ala)  Patient is on additional specialty medications: No  Informant: patient  Reliability of informant: reliable  Confirmed plan for next specialty medication refill: delivery by pharmacy  Refills needed for supportive medications: not needed          Refill Coordination    Has the Patients' Contact Information Changed: No  Is the Shipping Address Different: No         Were doses missed due to medication being on hold? No    BIKTARVY 50-200-25 mg tablet (bictegrav-emtricit-tenofov ala) : 10 days of medicine on hand       REFERRAL TO PHARMACIST     Referral to the pharmacist: Not needed      [facility]     Shipping address confirmed in Epic.     Patient was notified of new phone menu : Yes    Delivery Scheduled: Yes, Expected medication delivery date: 02/21/2023.     Medication will be delivered via Next Day Courier to the prescription address in Epic WAM.    Yolonda Kida   [facility] Specialty Technician

## 2023-02-20 MED FILL — BIKTARVY 50 MG-200 MG-25 MG TABLET: ORAL | 30 days supply | Qty: 30 | Fill #1

## 2023-03-05 NOTE — Unmapped (Signed)
[facility] Clinical Assessment & Refill Coordination Note    Austin Thompson, DOB: 05-06-1991  Phone: [phone] (home)     All above HIPAA information was verified with patient.     Was a Nurse, learning disability used for this call? No    Specialty Medication(s):   Infectious Disease: Biktarvy     Current Outpatient Medications   Medication Sig Dispense Refill    bictegrav-emtricit-tenofov ala (BIKTARVY) 50-200-25 mg tablet Take 1 tablet by mouth daily. 30 tablet 5    escitalopram oxalate (LEXAPRO) 5 MG tablet Take 1 tablet (5 mg total) by mouth daily.      fexofenadine (ALLEGRA) 180 MG tablet Take 180 mg by mouth daily. (Patient not taking: Reported on 01/03/2023)       No current facility-administered medications for this visit.        Changes to medications: Quante reports no changes at this time.    No Known Allergies    Changes to allergies: No    SPECIALTY MEDICATION ADHERENCE     Biktarvy 50-200-25 mg: approximately 17 days of medicine on hand       Medication Adherence    Patient reported X missed doses in the last month: 0  Specialty Medication: Biktarvy 50-200-25mg   Patient is on additional specialty medications: No  Any gaps in refill history greater than 2 weeks in the last 3 months: no  Demonstrates understanding of importance of adherence: yes  Informant: patient  Provider-estimated medication adherence level: good  Patient is at risk for Non-Adherence: No          Specialty medication(s) dose(s) confirmed: Regimen is correct and unchanged.     Are there any concerns with adherence? No    Adherence counseling provided? Not needed    CLINICAL MANAGEMENT AND INTERVENTION      Clinical Benefit Assessment:    Do you feel the medicine is effective or helping your condition? Yes    HIV ASSOCIATED LABS:     Lab Results   Component Value Date/Time    HIVRS Not Detected 01/04/2022 05:16 PM    HIVRS Not Detected 05/25/2021 04:16 PM    HIVRS Detected (A) 08/10/2020 08:53 AM    HIVCP <40 01/03/2023 10:50 AM    HIVCP 48 (H) 12/14/2020 08:35 AM    HIVCP <40 (H) 08/10/2020 08:53 AM    HIVCP 35,300 (H) 06/22/2020 12:51 PM    ACD4 936 01/04/2022 05:16 PM    ACD4 897 05/25/2021 04:16 PM    ACD4 640 12/14/2020 08:35 AM       Clinical Benefit counseling provided? Labs from 01/03/23 show evidence of clinical benefit    Adverse Effects Assessment:    Are you experiencing any side effects? No    Are you experiencing difficulty administering your medicine? No    Quality of Life Assessment:      How many days over the past month did your HIV  keep you from your normal activities? For example, brushing your teeth or getting up in the morning. 0    Have you discussed this with your provider? Not needed    Acute Infection Status:    Acute infections noted within Epic:  No active infections  Patient reported infection: None    Therapy Appropriateness:    Is therapy appropriate and patient progressing towards therapeutic goals? Yes, therapy is appropriate and should be continued    DISEASE/MEDICATION-SPECIFIC INFORMATION      N/A    HIV: Not Applicable  PATIENT SPECIFIC NEEDS     Does the patient have any physical, cognitive, or cultural barriers? No    Is the patient high risk? No    Did the patient require a clinical intervention? No    Does the patient require physician intervention or other additional services (i.e., nutrition, smoking cessation, social work)? No    SOCIAL DETERMINANTS OF HEALTH     At the [facility], we have learned that life circumstances - like trouble affording food, housing, utilities, or transportation can affect the health of many of our patients.   That is why we wanted to ask: are you currently experiencing any life circumstances that are negatively impacting your health and/or quality of life? Patient declined to answer    Social Determinants of Health     Financial Resource Strain: Not on file   Internet Connectivity: Not on file   Food Insecurity: Not on file   Tobacco Use: Low Risk (01/03/2023)    Patient History     Smoking Tobacco Use: Never     Smokeless Tobacco Use: Never     Passive Exposure: Not on file   Housing/Utilities: Not on file   Alcohol Use: Not on file   Transportation Needs: Not on file   Substance Use: Not on file   Health Literacy: Not on file   Physical Activity: Not on file   Interpersonal Safety: Not on file   Stress: Not on file   Intimate Partner Violence: Not on file   Depression: Not on file   Social Connections: Not on file       Would you be willing to receive help with any of the needs that you have identified today? Not applicable       SHIPPING     Specialty Medication(s) to be Shipped:   Infectious Disease: Biktarvy    Other medication(s) to be shipped: No additional medications requested for fill at this time     Changes to insurance: No    Patient was informed of new phone menu: Yes    Delivery Scheduled: Yes, Expected medication delivery date: 03/16/23.     Medication will be delivered via Next Day Courier to the confirmed prescription address in [facility].    The patient will receive a drug information handout for each medication shipped and additional FDA Medication Guides as required.  Verified that patient has previously received a Conservation officer, historic buildings and a Surveyor, mining.    The patient or caregiver noted above participated in the development of this care plan and knows that they can request review of or adjustments to the care plan at any time.      All of the patient's questions and concerns have been addressed.    [name], PharmD   [facility] Specialty Pharmacist

## 2023-03-15 MED FILL — BIKTARVY 50 MG-200 MG-25 MG TABLET: ORAL | 30 days supply | Qty: 30 | Fill #2

## 2023-04-02 NOTE — Progress Notes (Unsigned)
Established Patient Office Visit  Subjective    Patient ID: Craig Hicks, male    DOB: 03-19-1991  Age: 32 y.o. MRN: [id number]  CC:  No chief complaint on file.   HPI Craig Hicks presents for follow-up.  HIV: -Currently on Biktarvy 50-200-25 mg daily -He follows with ID at [facility]. Last seen in January, note reviewed from 01/04/22.  -Last viral loads were checked in 6/22. First diagnosed in 2021.   Vitamin D Deficiency: -Currently on 5000 IU daily -Vitamin D was last checked several years ago and was low at that time.  Anxiety: -Since he was diagnosed with a bad case of COVID in 2021 and then HIV, he has developed anxiety around his health.   -Currently on Lexapro 5 mg, Hydroxyzine 10 mg PRN - anxiety is much better controlled however he would like to go back down to the 5 mg dose because he is having some  lightheadedness and feeling more apathetic on the higher dose -He is currently seeing a therapist  -Prior to medication he had been experiencing diaphoresis, palpitations, lightheadedness, chest pains but these have improved since being the medication. He does have some occasional constipation  -Hydroxyzine is doing well to control panic attack symptoms  -He had been treated with multiple SSRI's and SNRIs in the past for depression.        11/22/2022    8:15 AM 10/09/2022    1:04 PM 07/07/2022    3:26 PM 05/26/2022    2:45 PM  Depression screen PHQ 2/9  Decreased Interest 0 0 0 1  Down, Depressed, Hopeless 0 0 0 0  PHQ - 2 Score 0 0 0 1  Altered sleeping 0 0 1 2  Tired, decreased energy 0 0 1 1  Change in appetite 0 0 0 0  Feeling bad or failure about yourself  0 0 0 0  Trouble concentrating 0 0 0 2  Moving slowly or fidgety/restless 0 0 1 1  Suicidal thoughts 0 0 0 0  PHQ-9 Score 0 0 3 7  Difficult doing work/chores Not difficult at all Not difficult at all Not difficult at all Not difficult at all    Health Maintenance: -Blood work up to date  Outpatient Encounter  Medications as of 04/03/2023  Medication Sig   bictegravir-emtricitabine-tenofovir AF (BIKTARVY) 50-200-25 MG TABS tablet Take 1 tablet by mouth daily.   Cholecalciferol (D3 5000) 125 MCG (5000 UT) capsule    escitalopram (LEXAPRO) 5 MG tablet Take 1 tablet (5 mg total) by mouth daily.   hydrOXYzine (ATARAX) 10 MG tablet Take 1 tablet (10 mg total) by mouth 3 (three) times daily as needed.   No facility-administered encounter medications on file as of 04/03/2023.    Past Medical History:  Diagnosis Date   Anxiety    Depression    GERD (gastroesophageal reflux disease)    HIV infection (HCC)     Past Surgical History:  Procedure Laterality Date   EXTERNAL EAR SURGERY     ears pinned back   VASECTOMY      Family History  Problem Relation Age of Onset   Anxiety disorder Mother    Cancer Mother    Vision loss Mother    Cancer Father    Vision loss Father    Alcohol abuse Maternal Grandfather    Diabetes Maternal Grandmother    Obesity Maternal Grandmother    Stroke Maternal Grandmother    Depression Paternal Grandmother     Social History  Socioeconomic History   Marital status: Married    Spouse name: Not on file   Number of children: Not on file   Years of education: Not on file   Highest education level: Not on file  Occupational History   Not on file  Tobacco Use   Smoking status: Never   Smokeless tobacco: Never  Vaping Use   Vaping Use: Never used  Substance and Sexual Activity   Alcohol use: Yes    Alcohol/week: 4.0 standard drinks of alcohol    Types: 4 Standard drinks or equivalent per week    Comment: occ   Drug use: Yes    Frequency: 1.0 times per week    Types: Psilocybin, Other-see comments    Comment: CBD   Sexual activity: Yes    Birth control/protection: Condom, I.U.D., Surgical  Other Topics Concern   Not on file  Social History Narrative   Not on file   Social Determinants of Health   Financial Resource Strain: Not on file  Food  Insecurity: Not on file  Transportation Needs: Not on file  Physical Activity: Not on file  Stress: Not on file  Social Connections: Not on file  Intimate Partner Violence: Not on file    Review of Systems  Constitutional:  Negative for chills and fever.  Respiratory:  Negative for cough and shortness of breath.   Cardiovascular:  Negative for chest pain.  Gastrointestinal:  Positive for constipation.        Objective    There were no vitals taken for this visit.  Physical Exam Constitutional:      Appearance: Normal appearance.  HENT:     Head: Normocephalic and atraumatic.  Eyes:     Conjunctiva/sclera: Conjunctivae normal.  Cardiovascular:     Rate and Rhythm: Normal rate and regular rhythm.  Pulmonary:     Effort: Pulmonary effort is normal.     Breath sounds: Normal breath sounds.  Skin:    General: Skin is warm and dry.  Neurological:     General: No focal deficit present.     Mental Status: He is alert. Mental status is at baseline.  Psychiatric:        Mood and Affect: Mood normal.        Behavior: Behavior normal.       Assessment & Plan:   1. Generalized anxiety disorder: Doing well, decrease to Lexapro 5 mg daily, sent to pharmacy. Follow up in 6 months or sooner as needed.  - escitalopram (LEXAPRO) 5 MG tablet; Take 1 tablet (5 mg total) by mouth daily.  Dispense: 90 tablet; Refill: 1  No follow-ups on file.   Margarita Mail, DO

## 2023-04-03 ENCOUNTER — Ambulatory Visit: Payer: BC Managed Care – PPO | Admitting: Internal Medicine

## 2023-04-03 ENCOUNTER — Encounter: Payer: Self-pay | Admitting: Internal Medicine

## 2023-04-03 VITALS — BP 122/62 | HR 89 | Temp 98.0°F | Resp 18 | Ht 74.0 in | Wt 200.0 lb

## 2023-04-03 DIAGNOSIS — Z13220 Encounter for screening for lipoid disorders: Secondary | ICD-10-CM | POA: Diagnosis not present

## 2023-04-03 DIAGNOSIS — F411 Generalized anxiety disorder: Secondary | ICD-10-CM | POA: Diagnosis not present

## 2023-04-03 MED ORDER — ESCITALOPRAM OXALATE 5 MG PO TABS: 5.0000 mg | ORAL_TABLET | Freq: Every day | ORAL | 3 refills | Status: AC

## 2023-04-04 LAB — CBC WITH DIFFERENTIAL/PLATELET
Absolute Monocytes: 348 cells/uL (ref 200–950)
Basophils Absolute: 60 cells/uL (ref 0–200)
Basophils Relative: 1.5 %
Eosinophils Absolute: 160 cells/uL (ref 15–500)
Eosinophils Relative: 4 %
HCT: 44 % (ref 38.5–50.0)
Hemoglobin: 15.5 g/dL (ref 13.2–17.1)
Lymphs Abs: 1856 cells/uL (ref 850–3900)
MCH: 31.2 pg (ref 27.0–33.0)
MCHC: 35.2 g/dL (ref 32.0–36.0)
MCV: 88.5 fL (ref 80.0–100.0)
MPV: 10.4 fL (ref 7.5–12.5)
Monocytes Relative: 8.7 %
Neutro Abs: 1576 cells/uL (ref 1500–7800)
Neutrophils Relative %: 39.4 %
Platelets: 247 10*3/uL (ref 140–400)
RBC: 4.97 10*6/uL (ref 4.20–5.80)
RDW: 13 % (ref 11.0–15.0)
Total Lymphocyte: 46.4 %
WBC: 4 10*3/uL (ref 3.8–10.8)

## 2023-04-04 LAB — LIPID PANEL
Cholesterol: 169 mg/dL (ref <200)
HDL: 46 mg/dL (ref >=40)
LDL Cholesterol (Calc): 108 mg/dL (calc) — ABNORMAL HIGH
Non-HDL Cholesterol (Calc): 123 mg/dL (calc) (ref <130)
Total CHOL/HDL Ratio: 3.7 (calc) (ref <5.0)
Triglycerides: 66 mg/dL (ref <150)

## 2023-04-04 LAB — COMPLETE METABOLIC PANEL WITH GFR
AG Ratio: 1.8 (calc) (ref 1.0–2.5)
ALT: 20 U/L (ref 9–46)
AST: 44 U/L — ABNORMAL HIGH (ref 10–40)
Albumin: 4.7 g/dL (ref 3.6–5.1)
Alkaline phosphatase (APISO): 77 U/L (ref 36–130)
BUN: 10 mg/dL (ref 7–25)
CO2: 27 mmol/L (ref 20–32)
Calcium: 10.2 mg/dL (ref 8.6–10.3)
Chloride: 102 mmol/L (ref 98–110)
Creat: 0.92 mg/dL (ref 0.60–1.24)
Globulin: 2.6 g/dL (calc) (ref 1.9–3.7)
Glucose, Bld: 83 mg/dL (ref 65–99)
Potassium: 4.5 mmol/L (ref 3.5–5.3)
Sodium: 140 mmol/L (ref 135–146)
Total Bilirubin: 0.8 mg/dL (ref 0.2–1.2)
Total Protein: 7.3 g/dL (ref 6.1–8.1)
eGFR: 115 mL/min/{1.73_m2} (ref >=60)

## 2023-04-09 ENCOUNTER — Other Ambulatory Visit: Payer: Self-pay | Admitting: Internal Medicine

## 2023-04-09 DIAGNOSIS — F411 Generalized anxiety disorder: Secondary | ICD-10-CM

## 2023-04-10 NOTE — Telephone Encounter (Signed)
Call to pharmacy- that Rx is ready for pick up- Rx 04/03/23 #90 3RF Requested Prescriptions  Pending Prescriptions Disp Refills   escitalopram (LEXAPRO) 5 MG tablet [Pharmacy Med Name: ESCITALOPRAM  TABLETS] 90 tablet 3    Sig: TAKE 1 TABLET(5 MG) BY MOUTH DAILY     Psychiatry:  Antidepressants - SSRI Passed - 04/09/2023  3:10 AM      Passed - Valid encounter within last 6 months    Recent Outpatient Visits           1 week ago Generalized anxiety disorder   [facility] Margarita Mail, DO   4 months ago Abscess   [facility] Margarita Mail, DO   6 months ago Generalized anxiety disorder   [facility] Margarita Mail, DO   9 months ago Generalized anxiety disorder   [facility] Margarita Mail, DO   10 months ago Generalized anxiety disorder   [facility] Margarita Mail, DO       Future Appointments             In 11 months Margarita Mail, DO [facility], [facility]

## 2023-04-18 NOTE — Unmapped (Signed)
[facility] Specialty Pharmacy Refill Coordination Note    Specialty Medication(s) to be Shipped:   Infectious Disease: Biktarvy    Other medication(s) to be shipped: No additional medications requested for fill at this time     Austin Thompson, DOB: 01/20/1991  Phone: [phone] (home)       All above HIPAA information was verified with patient.     Was a Nurse, learning disability used for this call? No    Completed refill call assessment today to schedule patient's medication shipment from the [facility] [phone]).  All relevant notes have been reviewed.     Specialty medication(s) and dose(s) confirmed: Regimen is correct and unchanged.   Changes to medications: Austin Thompson reports no changes at this time.  Changes to insurance: No  New side effects reported not previously addressed with a pharmacist or physician: None reported  Questions for the pharmacist: No    Confirmed patient received a Conservation officer, historic buildings and a Surveyor, mining with first shipment. The patient will receive a drug information handout for each medication shipped and additional FDA Medication Guides as required.       DISEASE/MEDICATION-SPECIFIC INFORMATION        N/A    SPECIALTY MEDICATION ADHERENCE     Medication Adherence    Patient reported X missed doses in the last month: 0  Specialty Medication: biktarvy 50-200-25mg   Patient is on additional specialty medications: No                Were doses missed due to medication being on hold? No    BIKTARVY 50-200-25 mg tablet (bictegrav-emtricit-tenofov ala) : 7 days of medicine on hand       REFERRAL TO PHARMACIST     Referral to the pharmacist: Not needed      [facility]     Shipping address confirmed in Epic.     Patient was notified of new phone menu : Yes    Delivery Scheduled: Yes, Expected medication delivery date: 04/24/2023.     Medication will be delivered via Next Day Courier to the prescription address in Epic WAM.    Gaspar Cola Shared [facility] Specialty Technician

## 2023-04-23 MED FILL — BIKTARVY 50 MG-200 MG-25 MG TABLET: ORAL | 30 days supply | Qty: 30 | Fill #3

## 2023-05-16 NOTE — Unmapped (Signed)
[facility] Specialty Pharmacy Refill Coordination Note    Attikus Rosendale, DOB: 11-Apr-1991  Phone: [phone] (home)       All above HIPAA information was verified with patient.         05/16/2023    10:56 AM   Specialty Rx Medication Refill Questionnaire   Which Medications would you like refilled and shipped? Biktarvy, I have a weeks worth on hand.   Please list all current allergies: Grass Pollen and dander   Have you missed any doses in the last 30 days? No   Have you had any changes to your medication(s) since your last refill? No   How many days remaining of each medication do you have at home? 7 days   Have you experienced any side effects in the last 30 days? No   Please enter the full address (street address, city, state, zip code) where you would like your medication(s) to be delivered to. [address] [place], [place], [address]   Please specify on which day you would like your medication(s) to arrive. Note: if you need your medication(s) within 3 days, please call the pharmacy to schedule your order at [phone]  05/22/2023   Has your insurance changed since your last refill? No   Would you like a pharmacist to call you to discuss your medication(s)? Yes   Please enter the preferred phone number where you can be reached. [phone]   Do you require a signature for your package? (Note: if we are billing Medicare Part B or your order contains a controlled substance, we will require a signature) No         Completed refill call assessment today to schedule patient's medication shipment from the [facility] [phone]).  All relevant notes have been reviewed.       Confirmed patient received a Conservation officer, historic buildings and a Surveyor, mining with first shipment. The patient will receive a drug information handout for each medication shipped and additional FDA Medication Guides as required.         REFERRAL TO PHARMACIST     Referral to the pharmacist: Yes - patient request      SHIPPING     Shipping address confirmed in Epic.     Delivery Scheduled: Yes, Expected medication delivery date: 05/22/2023.     Medication will be delivered via Same Day Courier to the prescription address in Epic WAM.    [name]   [facility] Specialty Technician

## 2023-05-18 NOTE — Unmapped (Signed)
Response to patient question:    Mr Micucci had requested a pharmacist call him regarding his medications when filling out his myChart refill questionnaire.  I called him today and he wanted to know more about the process of making sure his upcoming new insurance is entered and available for use.  He said he should have new insurance in June.  I told him to just give [place] a call as soon as he finds out the insurance information and we can load it in the system and run a test claim when allowed to make sure the new insurance is working and if a prior authorization is needed, we will start that process as well.  Mr Summit verbalized understanding and said he will call the [facility] as soon as he knows this information.    Corliss Skains. [place], [place].D.  Specialty Pharmacist  [facility]  [phone] option 4,  then option 4 again

## 2023-05-21 MED FILL — BIKTARVY 50 MG-200 MG-25 MG TABLET: ORAL | 30 days supply | Qty: 30 | Fill #4

## 2023-06-12 ENCOUNTER — Telehealth: Payer: Self-pay | Admitting: Urology

## 2023-06-12 NOTE — Telephone Encounter (Signed)
Patient called with concerns about pain he has been having off and on since his vasectomy on 02/10/22. He said that Dr. Lonna Cobb had told him there had been some difficulty on the left side during his procedure. Patient said over time that pain has become more frequent, with sharp pain in left testicle. He said this occurs during intercourse, and sexually related acts. He is wondering if this could be related to the vasectomy procedure. Any recommendations, or does he need to be seen for appointment with Dr. Lonna Cobb. Please advise patient.

## 2023-06-14 NOTE — Unmapped (Signed)
[facility] Specialty Pharmacy Refill Coordination Note    Austin Thompson, DOB: 04-Nov-1991  Phone: [phone] (home)       All above HIPAA information was verified with patient.         06/14/2023     3:41 PM   Specialty Rx Medication Refill Questionnaire   Which Medications would you like refilled and shipped? Biktarvy   Please list all current allergies: None   Have you missed any doses in the last 30 days? Yes   If Yes, please choose the number of missed doses below: 0-2   Have you had any changes to your medication(s) since your last refill? No   How many days remaining of each medication do you have at home? 10   Have you experienced any side effects in the last 30 days? No   Please enter the full address (street address, city, state, zip code) where you would like your medication(s) to be delivered to. [address] [place] [address]   Please specify on which day you would like your medication(s) to arrive. Note: if you need your medication(s) within 3 days, please call the pharmacy to schedule your order at [phone]  06/19/2023   Has your insurance changed since your last refill? Yes   If YES, please enter your new insurance information below. I've called in with the new info already   Would you like a pharmacist to call you to discuss your medication(s)? Yes   Please enter the preferred phone number where you can be reached. [phone]   Do you require a signature for your package? (Note: if we are billing Medicare Part B or your order contains a controlled substance, we will require a signature) No         Completed refill call assessment today to schedule patient's medication shipment from the [facility] [phone]).  All relevant notes have been reviewed.       Confirmed patient received a Conservation officer, historic buildings and a Surveyor, mining with first shipment. The patient will receive a drug information handout for each medication shipped and additional FDA Medication Guides as required. REFERRAL TO PHARMACIST     Referral to the pharmacist: Yes - patient request      SHIPPING     Shipping address confirmed in Epic.     Delivery Scheduled: Yes, Expected medication delivery date: 06/19/2023.     Medication will be delivered via Next Day Courier to the prescription address in Epic WAM.    Austin Thompson   [facility] Specialty Technician

## 2023-06-18 MED FILL — BIKTARVY 50 MG-200 MG-25 MG TABLET: ORAL | 30 days supply | Qty: 30 | Fill #5

## 2023-07-10 ENCOUNTER — Ambulatory Visit (INDEPENDENT_AMBULATORY_CARE_PROVIDER_SITE_OTHER): Payer: PRIVATE HEALTH INSURANCE | Admitting: Internal Medicine

## 2023-07-10 ENCOUNTER — Encounter: Payer: Self-pay | Admitting: Internal Medicine

## 2023-07-10 VITALS — BP 118/70 | HR 80 | Temp 97.5°F | Resp 16 | Ht 74.0 in | Wt 187.8 lb

## 2023-07-10 DIAGNOSIS — E559 Vitamin D deficiency, unspecified: Secondary | ICD-10-CM | POA: Diagnosis not present

## 2023-07-10 DIAGNOSIS — K909 Intestinal malabsorption, unspecified: Secondary | ICD-10-CM

## 2023-07-10 DIAGNOSIS — R197 Diarrhea, unspecified: Secondary | ICD-10-CM | POA: Diagnosis not present

## 2023-07-10 DIAGNOSIS — R1013 Epigastric pain: Secondary | ICD-10-CM

## 2023-07-10 NOTE — Progress Notes (Signed)
Acute Office Visit  Subjective:     Patient ID: Craig Hicks, male    DOB: [birth year], 32 y.o.   MRN: [id number]  Chief Complaint  Patient presents with   Abdominal Pain    Years, gassy, greasy stools    HPI Patient is in today for digestive issues, diarrhea. Had allergy testing done in the past, showed mild reaction to eggs, wheat and corn.   Abdominal Complaint: -Duration:  18 months  -Frequency: a few times a week, comes and goes  -Nature: bloating, dull, and aching -Location: diffuse, epigastric, and LLQ  -Radiation: no -Alleviating factors: Nothing -Aggravating factors: Nothing in particular - uncertain if unrelated to eating, tried to eliminate food groups but didn't notice any difference  -Constipation: intermittent -Diarrhea: yes -Episodes of diarrhea/day: -Mucous in the stool: yes -Heartburn: no -Bloating:yes -Passing Gas: yes -Nausea: yes -Vomiting: no -Melena or hematochezia: no -Rash: no not currently -Jaundice: no -Fever: no -Weight loss: yes -Change in Appetite: no   Review of Systems  Constitutional:  Negative for chills and fever.  Gastrointestinal:  Positive for abdominal pain, blood in stool, constipation, diarrhea and nausea. Negative for heartburn, melena and vomiting.        Objective:    BP 118/70   Pulse 80   Temp (!) 97.5 F (36.4 C)   Resp 16   Ht 6\' 2"  (1.88 m)   Wt 187 lb 12.8 oz (85.2 kg)   SpO2 100%   BMI 24.11 kg/m  BP Readings from Last 3 Encounters:  07/10/23 118/70  04/03/23 122/62  11/22/22 126/68   Wt Readings from Last 3 Encounters:  07/10/23 187 lb 12.8 oz (85.2 kg)  04/03/23 200 lb (90.7 kg)  11/22/22 205 lb 8 oz (93.2 kg)      Physical Exam Constitutional:      Appearance: He is well-developed.  HENT:     Head: Normocephalic and atraumatic.  Eyes:     Conjunctiva/sclera: Conjunctivae normal.  Cardiovascular:     Rate and Rhythm: Normal rate and regular rhythm.  Pulmonary:     Effort: Pulmonary  effort is normal.     Breath sounds: Normal breath sounds.  Abdominal:     General: Bowel sounds are normal. There is no distension.     Palpations: Abdomen is soft.     Tenderness: There is no abdominal tenderness. There is no right CVA tenderness, left CVA tenderness, guarding or rebound.     Comments: Mild discomfort in the LLQ to palpation  Skin:    General: Skin is warm and dry.  Neurological:     General: No focal deficit present.     Mental Status: He is alert. Mental status is at baseline.  Psychiatric:        Mood and Affect: Mood normal.        Behavior: Behavior normal.     No results found for any visits on 07/10/23.      Assessment & Plan:   1. Diarrhea due to malabsorption/Epigastric pain/Vitamin D deficiency: Stools 5-6 on the [facility] stool chart, appears to be a malabsorption issue. Will rule out celiac disease, obtain RUQ [place] to assess gallbladder and obtain a cal-protectin to rule out IBD as well. History if vitamin D deficiency, on 5000 international units daily of vitamin D supplements, will check levels today.    - Celiac Disease Panel - US Abdomen Limited RUQ (LIVER/GB); Future - Pancreatic elastase, fecal - CALPROTECTIN - Vitamin D (25 hydroxy)   Return  for already scheduled.  Margarita Mail, DO

## 2023-07-10 NOTE — Patient Instructions (Addendum)
It was great seeing you today!  Plan discussed at today's visit: -Blood work ordered today, results will be uploaded to MyChart.  -Please call number provided to schedule ultrasound  Follow up in: as neede  Take care and let us know if you have any questions or concerns prior to your next visit.  Dr. Caralee Ates

## 2023-07-11 DIAGNOSIS — B20 Human immunodeficiency virus [HIV] disease: Principal | ICD-10-CM

## 2023-07-11 LAB — CELIAC DISEASE PANEL
(tTG) Ab, IgA: <1 U/mL
(tTG) Ab, IgG: <1 U/mL
Gliadin IgA: <1 U/mL
Gliadin IgG: 3.6 U/mL
Immunoglobulin A: 300 mg/dL (ref 47–310)

## 2023-07-11 LAB — VITAMIN D 25 HYDROXY (VIT D DEFICIENCY, FRACTURES): Vit D, 25-Hydroxy: 39 ng/mL (ref 30–100)

## 2023-07-11 MED ORDER — BIKTARVY 50 MG-200 MG-25 MG TABLET
ORAL_TABLET | Freq: Every day | ORAL | 1 refills | 30 days | Status: CP
Start: 2023-07-11 — End: ?
  Filled 2023-07-19: qty 30, 30d supply, fill #0

## 2023-07-11 NOTE — Unmapped (Signed)
Medication Requested:  Biktarvy       No future appointments.  Per Provider Note: Current regimen: Biktarvy (BIC/FTC/TAF)  Misses doses of ARVs rarely    Standing order protocol requirements met?: Yes    Sent to: Pharmacy per protocol    Days Supply Given: 30 days  Number of Refills: 1

## 2023-07-11 NOTE — Unmapped (Signed)
[facility] Specialty Pharmacy Refill Coordination Note    Austin Thompson, DOB: 1991/08/21  Phone: [phone] (home)       All above HIPAA information was verified with patient.         07/11/2023     9:18 AM   Specialty Rx Medication Refill Questionnaire   Which Medications would you like refilled and shipped? Biktarvy   Please list all current allergies: None   Have you missed any doses in the last 30 days? No   Have you had any changes to your medication(s) since your last refill? No   How many days remaining of each medication do you have at home? 10   Have you experienced any side effects in the last 30 days? No   Please enter the full address (street address, city, state, zip code) where you would like your medication(s) to be delivered to. [address] [place] [address]   Please specify on which day you would like your medication(s) to arrive. Note: if you need your medication(s) within 3 days, please call the pharmacy to schedule your order at [phone]  07/10/2023   Has your insurance changed since your last refill? No   Would you like a pharmacist to call you to discuss your medication(s)? No   Do you require a signature for your package? (Note: if we are billing Medicare Part B or your order contains a controlled substance, we will require a signature) No         Completed refill call assessment today to schedule patient's medication shipment from the [facility] [phone]).  All relevant notes have been reviewed.       Confirmed patient received a Conservation officer, historic buildings and a Surveyor, mining with first shipment. The patient will receive a drug information handout for each medication shipped and additional FDA Medication Guides as required.         REFERRAL TO PHARMACIST     Referral to the pharmacist: Not needed      [facility]     Shipping address confirmed in Epic.     Delivery Scheduled: Yes, Expected medication delivery date: 07/17/2023.  However, Rx request for refills was sent to the provider as there are none remaining.     Medication will be delivered via Next Day Courier to the prescription address in Epic [place]. 07/11/23-spoke with pt to confirm delivery date    [name]   [facility] Specialty Technician

## 2023-07-16 NOTE — Unmapped (Signed)
Austin Thompson 's Biktarvy shipment will be delayed as a result of pts new insurance requires pt to call and complete on-boarding before med can be processed for a paid claim    I have reached out to the patient  at [phone] and left a voicemail message.  We will wait for a call back from the patient to reschedule the delivery.  We have not confirmed the new delivery date.

## 2023-07-19 ENCOUNTER — Ambulatory Visit
Admission: RE | Admit: 2023-07-19 | Discharge: 2023-07-19 | Disposition: A | Discharge status: Home / Self Care | Payer: PRIVATE HEALTH INSURANCE | Source: Ambulatory Visit | Attending: Internal Medicine | Admitting: Internal Medicine

## 2023-07-19 DIAGNOSIS — K909 Intestinal malabsorption, unspecified: Secondary | ICD-10-CM | POA: Insufficient documentation

## 2023-07-19 DIAGNOSIS — R197 Diarrhea, unspecified: Secondary | ICD-10-CM | POA: Diagnosis present

## 2023-07-19 DIAGNOSIS — R1013 Epigastric pain: Secondary | ICD-10-CM | POA: Diagnosis present

## 2023-07-19 LAB — PANCREATIC ELASTASE, FECAL: Pancreatic Elastase-1, Stool: 310 mcg/g

## 2023-07-19 LAB — CALPROTECTIN: Calprotectin: 6 mcg/g

## 2023-07-19 NOTE — Unmapped (Signed)
Daniel Nones 's Biktarvy shipment will be sent out  as a result of override placed by insurance to have [facility] fill.     I have reached out to the patient  at [phone] and communicated the delivery change. We will reschedule the medication for the delivery date that the patient agreed upon.  We have confirmed the delivery date as 7/25, via same day courier.

## 2023-08-10 DIAGNOSIS — B20 Human immunodeficiency virus [HIV] disease: Principal | ICD-10-CM

## 2023-08-10 NOTE — Unmapped (Signed)
-----   Message from [place] F sent at 08/10/2023  9:07 AM EDT -----  Incoming Call  Caller: Daniel Nones  Best callback number: [phone]  Reason for call: Patient called needing SPT Codes for labs that will be done at next appointment. He wanted to know these so that he could check with his new insurance what his co pay will be. He has not heard back from financial who he reached to last month.        Thank you!

## 2023-08-10 NOTE — Unmapped (Signed)
Medication Requested: BIKTARVY       No future appointments.  Per Provider Note: Current regimen: Biktarvy (BIC/FTC/TAF)     Standing order protocol requirements met?: No    Sent to: Provider for signing    Days Supply Given: 0  Number of Refills: 0

## 2023-08-13 NOTE — Unmapped (Addendum)
RN Follow -up Call  Called patient back. He was wanting RX faxed to [facility]. I told him I would with in the next hour. Pt verbalized understanding.    --------------------------------------------------------------------------------------------------------    ----- Message from Karalee Height sent at 08/13/2023 10:56 AM EDT -----  Incoming Call  Caller: Daniel Nones  Best callback number: [phone]  Reason for call: Please call pt about prescrpiton        Thank you!

## 2023-08-16 ENCOUNTER — Telehealth: Payer: Self-pay | Admitting: Internal Medicine

## 2023-08-16 DIAGNOSIS — B20 Human immunodeficiency virus [HIV] disease: Principal | ICD-10-CM

## 2023-08-16 NOTE — Unmapped (Signed)
Medication Requested:  Austin Thompson (BIC/FTC/TAF       Future Appointments   Date Time Provider Department Center   09/19/2023  3:00 PM Leveda Anna, MD [facility]     Per Provider Note: Current regimen: Biktarvy (BIC/FTC/TAF     Standing order protocol requirements met?: No    Sent to: Provider for signing    Days Supply Given: 0  Number of Refills: 0

## 2023-08-16 NOTE — Telephone Encounter (Signed)
Copied from CRM [id number]. 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 Stated he just needs a yearly prescription and blood work.  Please advise.

## 2023-08-16 NOTE — Telephone Encounter (Signed)
Is this something you are willing to take over?

## 2023-08-20 NOTE — Telephone Encounter (Signed)
Pt.notified

## 2023-08-23 DIAGNOSIS — B20 Human immunodeficiency virus [HIV] disease: Principal | ICD-10-CM

## 2023-08-23 NOTE — Unmapped (Addendum)
Specialty Medication(s): Biktarvy 50-200-25mg     Austin Thompson has been dis-enrolled from the [facility] specialty pharmacy services due to a pharmacy change. The patient is now filling at the following pharmacy:   I added it to his pharmacy list.  They can take electronic Rxs.      [facility]    [address], [place], [place] [address]  [phone]    [address], [place], [place] [address]  [phone]    Additional information provided to the patient: I told him if anything changes, we will be happy to fill for him again    [name], PharmD  [facility] Specialty Pharmacist

## 2023-08-24 DIAGNOSIS — B20 Human immunodeficiency virus [HIV] disease: Principal | ICD-10-CM

## 2023-08-24 MED ORDER — BIKTARVY 50 MG-200 MG-25 MG TABLET
ORAL_TABLET | Freq: Every day | ORAL | 0 refills | 90 days | Status: CP
Start: 2023-08-24 — End: ?

## 2023-08-30 DIAGNOSIS — B20 Human immunodeficiency virus [HIV] disease: Principal | ICD-10-CM

## 2023-08-30 MED ORDER — BIKTARVY 50 MG-200 MG-25 MG TABLET
ORAL_TABLET | Freq: Every day | ORAL | 1 refills | 30 days
Start: 2023-08-30 — End: ?

## 2023-08-31 DIAGNOSIS — B20 Human immunodeficiency virus [HIV] disease: Principal | ICD-10-CM

## 2023-08-31 MED ORDER — BIKTARVY 50 MG-200 MG-25 MG TABLET
ORAL_TABLET | Freq: Every day | ORAL | 0 refills | 30 days | Status: CP
Start: 2023-08-31 — End: ?
  Filled 2023-09-04: qty 30, 30d supply, fill #0

## 2023-09-19 ENCOUNTER — Ambulatory Visit
Admit: 2023-09-19 | Discharge: 2023-09-20 | Payer: PRIVATE HEALTH INSURANCE | Attending: Student in an Organized Health Care Education/Training Program | Primary: Student in an Organized Health Care Education/Training Program

## 2023-09-19 DIAGNOSIS — Z23 Encounter for immunization: Principal | ICD-10-CM

## 2023-09-19 DIAGNOSIS — B20 Human immunodeficiency virus [HIV] disease: Principal | ICD-10-CM

## 2023-09-24 DIAGNOSIS — B20 Human immunodeficiency virus [HIV] disease: Principal | ICD-10-CM

## 2023-09-24 MED ORDER — BIKTARVY 50 MG-200 MG-25 MG TABLET
ORAL_TABLET | Freq: Every day | ORAL | 0 refills | 30 days
Start: 2023-09-24 — End: ?

## 2023-09-25 MED ORDER — BIKTARVY 50 MG-200 MG-25 MG TABLET
ORAL_TABLET | Freq: Every day | ORAL | 5 refills | 30 days | Status: CP
Start: 2023-09-25 — End: ?

## 2023-10-24 NOTE — Unmapped (Signed)
Specialty Medication(s): Biktarvy 50-200-25mg     Austin Thompson has been dis-enrolled from the [facility] specialty pharmacy services due to a pharmacy change. The patient is now filling at a pharmacy through his insurance (name unknown).  Dr. Gypsy Lore spoke with him and verified he is receiving and taking his Biktarvy. .    Additional information provided to the patient: n/a    I can re-enroll the patient to receive this medication from the [facility] in the future if needed.      [name], PharmD  [facility] Specialty Pharmacist

## 2023-11-08 DIAGNOSIS — B20 Human immunodeficiency virus [HIV] disease: Principal | ICD-10-CM

## 2023-11-08 MED ORDER — BIKTARVY 50 MG-200 MG-25 MG TABLET
ORAL_TABLET | Freq: Every day | ORAL | 4 refills | 30 days | Status: CP
Start: 2023-11-08 — End: ?

## 2024-03-19 ENCOUNTER — Ambulatory Visit
Admit: 2024-03-19 | Discharge: 2024-03-20 | Attending: Student in an Organized Health Care Education/Training Program | Primary: Student in an Organized Health Care Education/Training Program

## 2024-03-19 DIAGNOSIS — B20 Human immunodeficiency virus [HIV] disease: Principal | ICD-10-CM

## 2024-03-19 DIAGNOSIS — Z79899 Other long term (current) drug therapy: Principal | ICD-10-CM

## 2024-03-19 DIAGNOSIS — Z5181 Encounter for therapeutic drug level monitoring: Principal | ICD-10-CM

## 2024-03-19 DIAGNOSIS — Z9189 Other specified personal risk factors, not elsewhere classified: Principal | ICD-10-CM

## 2024-03-19 MED ORDER — BIKTARVY 50 MG-200 MG-25 MG TABLET
ORAL_TABLET | Freq: Every day | ORAL | 3 refills | 90 days | Status: CP
Start: 2024-03-19 — End: ?

## 2024-04-03 ENCOUNTER — Ambulatory Visit: Payer: Self-pay | Admitting: Internal Medicine
